# Patient Record
Sex: Male | Born: 1935 | Hispanic: No | Marital: Married
Health system: Southern US, Community
[De-identification: ages and names within clinical notes are randomized; demographics above are authoritative.]

---

## 2004-12-10 ENCOUNTER — Ambulatory Visit: Payer: Self-pay | Admitting: Radiation Oncology

## 2004-12-24 ENCOUNTER — Ambulatory Visit: Payer: Self-pay | Admitting: Radiation Oncology

## 2005-01-24 ENCOUNTER — Ambulatory Visit: Payer: Self-pay | Admitting: Radiation Oncology

## 2005-04-24 ENCOUNTER — Emergency Department: Payer: Self-pay | Admitting: Emergency Medicine

## 2005-04-26 ENCOUNTER — Emergency Department: Payer: Self-pay | Admitting: Emergency Medicine

## 2005-06-27 ENCOUNTER — Ambulatory Visit: Payer: Self-pay | Admitting: Radiation Oncology

## 2005-07-26 ENCOUNTER — Ambulatory Visit: Payer: Self-pay | Admitting: Radiation Oncology

## 2005-11-06 ENCOUNTER — Ambulatory Visit: Payer: Self-pay | Admitting: Gastroenterology

## 2006-01-21 ENCOUNTER — Ambulatory Visit: Payer: Self-pay

## 2006-10-02 ENCOUNTER — Ambulatory Visit: Payer: Self-pay | Admitting: Radiation Oncology

## 2006-10-25 ENCOUNTER — Ambulatory Visit: Payer: Self-pay | Admitting: Radiation Oncology

## 2007-03-27 ENCOUNTER — Ambulatory Visit: Payer: Self-pay | Admitting: Radiation Oncology

## 2007-04-20 ENCOUNTER — Ambulatory Visit: Payer: Self-pay | Admitting: Radiation Oncology

## 2007-04-27 ENCOUNTER — Ambulatory Visit: Payer: Self-pay | Admitting: Radiation Oncology

## 2007-11-09 ENCOUNTER — Ambulatory Visit: Payer: Self-pay | Admitting: Urology

## 2007-12-11 ENCOUNTER — Ambulatory Visit: Payer: Self-pay | Admitting: Urology

## 2008-02-24 ENCOUNTER — Other Ambulatory Visit: Payer: Self-pay

## 2008-02-24 ENCOUNTER — Inpatient Hospital Stay: Payer: Self-pay | Admitting: Internal Medicine

## 2008-03-03 ENCOUNTER — Other Ambulatory Visit: Payer: Self-pay

## 2008-03-03 ENCOUNTER — Emergency Department: Payer: Self-pay | Admitting: Emergency Medicine

## 2008-03-24 ENCOUNTER — Emergency Department: Payer: Self-pay | Admitting: Emergency Medicine

## 2008-03-24 ENCOUNTER — Other Ambulatory Visit: Payer: Self-pay

## 2008-04-28 ENCOUNTER — Ambulatory Visit: Payer: Self-pay | Admitting: General Surgery

## 2009-03-10 ENCOUNTER — Observation Stay: Payer: Self-pay | Admitting: Internal Medicine

## 2009-03-17 ENCOUNTER — Ambulatory Visit: Payer: Self-pay | Admitting: Urology

## 2009-06-29 ENCOUNTER — Emergency Department: Payer: Self-pay | Admitting: Emergency Medicine

## 2009-07-28 ENCOUNTER — Ambulatory Visit: Payer: Self-pay | Admitting: Specialist

## 2009-10-20 ENCOUNTER — Emergency Department: Payer: Self-pay | Admitting: Emergency Medicine

## 2009-10-26 ENCOUNTER — Emergency Department: Payer: Self-pay | Admitting: Internal Medicine

## 2009-12-06 ENCOUNTER — Ambulatory Visit: Payer: Self-pay | Admitting: Ophthalmology

## 2009-12-12 ENCOUNTER — Ambulatory Visit: Payer: Self-pay | Admitting: Cardiology

## 2009-12-20 ENCOUNTER — Ambulatory Visit: Payer: Self-pay | Admitting: Ophthalmology

## 2010-02-16 ENCOUNTER — Ambulatory Visit: Payer: Self-pay | Admitting: Specialist

## 2010-06-13 ENCOUNTER — Emergency Department: Payer: Self-pay | Admitting: Unknown Physician Specialty

## 2010-06-28 ENCOUNTER — Ambulatory Visit: Payer: Self-pay | Admitting: Urology

## 2010-07-17 ENCOUNTER — Ambulatory Visit: Payer: Self-pay | Admitting: Urology

## 2010-08-01 ENCOUNTER — Ambulatory Visit: Payer: Self-pay | Admitting: Urology

## 2010-10-11 ENCOUNTER — Emergency Department: Payer: Self-pay | Admitting: Emergency Medicine

## 2011-03-05 ENCOUNTER — Ambulatory Visit: Payer: Self-pay | Admitting: Urology

## 2011-03-06 ENCOUNTER — Ambulatory Visit: Payer: Self-pay | Admitting: Urology

## 2011-03-08 LAB — PATHOLOGY REPORT

## 2011-03-17 ENCOUNTER — Ambulatory Visit: Payer: Self-pay | Admitting: Internal Medicine

## 2011-04-15 ENCOUNTER — Ambulatory Visit: Payer: Self-pay | Admitting: Oncology

## 2011-04-17 ENCOUNTER — Ambulatory Visit: Payer: Self-pay | Admitting: Oncology

## 2011-04-27 ENCOUNTER — Ambulatory Visit: Payer: Self-pay | Admitting: Oncology

## 2011-05-27 ENCOUNTER — Ambulatory Visit: Payer: Self-pay | Admitting: Oncology

## 2011-05-30 ENCOUNTER — Ambulatory Visit: Payer: Self-pay | Admitting: Urology

## 2011-06-05 ENCOUNTER — Ambulatory Visit: Payer: Self-pay | Admitting: Urology

## 2011-07-13 ENCOUNTER — Emergency Department: Payer: Self-pay | Admitting: Emergency Medicine

## 2011-08-14 ENCOUNTER — Ambulatory Visit: Payer: Self-pay | Admitting: Internal Medicine

## 2011-09-09 ENCOUNTER — Ambulatory Visit: Payer: Self-pay

## 2011-10-09 ENCOUNTER — Ambulatory Visit: Payer: Self-pay | Admitting: Oncology

## 2011-10-09 LAB — CBC CANCER CENTER
Basophil #: 0 x10 3/mm (ref 0.0–0.1)
Eosinophil #: 0.4 x10 3/mm (ref 0.0–0.7)
Lymphocyte #: 1.2 x10 3/mm (ref 1.0–3.6)
Lymphocyte %: 17.5 %
MCH: 32.3 pg (ref 26.0–34.0)
Monocyte #: 0.5 x10 3/mm (ref 0.0–0.7)
Monocyte %: 7.4 %
Neutrophil #: 4.9 x10 3/mm (ref 1.4–6.5)
Neutrophil %: 69 %
Platelet: 280 x10 3/mm (ref 150–440)
RDW: 14.3 % (ref 11.5–14.5)
WBC: 7.1 x10 3/mm (ref 3.8–10.6)

## 2011-10-09 LAB — BASIC METABOLIC PANEL
Anion Gap: 9 (ref 7–16)
Calcium, Total: 8.3 mg/dL — ABNORMAL LOW (ref 8.5–10.1)
Chloride: 100 mmol/L (ref 98–107)
Co2: 29 mmol/L (ref 21–32)
EGFR (African American): >60
Osmolality: 283 (ref 275–301)

## 2011-10-10 LAB — KAPPA/LAMBDA FREE LIGHT CHAINS (ARMC)

## 2011-10-10 LAB — PROT IMMUNOELECTROPHORES(ARMC)

## 2011-10-25 ENCOUNTER — Ambulatory Visit: Payer: Self-pay | Admitting: Oncology

## 2011-11-25 ENCOUNTER — Ambulatory Visit: Payer: Self-pay | Admitting: Oncology

## 2012-01-20 ENCOUNTER — Ambulatory Visit: Payer: Self-pay | Admitting: Urology

## 2012-01-20 LAB — CBC WITH DIFFERENTIAL/PLATELET
Basophil %: 1.3 %
Eosinophil #: 0.5 10*3/uL (ref 0.0–0.7)
HGB: 13.8 g/dL (ref 13.0–18.0)
Lymphocyte #: 1.4 10*3/uL (ref 1.0–3.6)
Lymphocyte %: 18.6 %
MCH: 31.3 pg (ref 26.0–34.0)
MCHC: 33 g/dL (ref 32.0–36.0)
MCV: 95 fL (ref 80–100)
Monocyte #: 0.6 x10 3/mm (ref 0.2–1.0)
Monocyte %: 7.9 %
Neutrophil %: 65 %
Platelet: 266 10*3/uL (ref 150–440)
RBC: 4.41 10*6/uL (ref 4.40–5.90)
WBC: 7.6 10*3/uL (ref 3.8–10.6)

## 2012-01-20 LAB — BASIC METABOLIC PANEL
Anion Gap: 9 (ref 7–16)
Calcium, Total: 8.6 mg/dL (ref 8.5–10.1)
Chloride: 103 mmol/L (ref 98–107)
Co2: 28 mmol/L (ref 21–32)
Creatinine: 1.07 mg/dL (ref 0.60–1.30)
Potassium: 3.9 mmol/L (ref 3.5–5.1)

## 2012-01-21 LAB — URINE CULTURE

## 2012-01-29 ENCOUNTER — Ambulatory Visit: Payer: Self-pay | Admitting: Urology

## 2012-01-31 LAB — PATHOLOGY REPORT

## 2012-02-05 ENCOUNTER — Emergency Department: Payer: Self-pay | Admitting: Emergency Medicine

## 2012-02-05 LAB — COMPREHENSIVE METABOLIC PANEL
Alkaline Phosphatase: 99 U/L (ref 50–136)
Anion Gap: 8 (ref 7–16)
Calcium, Total: 8.3 mg/dL — ABNORMAL LOW (ref 8.5–10.1)
Chloride: 103 mmol/L (ref 98–107)
Creatinine: 0.89 mg/dL (ref 0.60–1.30)
EGFR (African American): >60
Glucose: 154 mg/dL — ABNORMAL HIGH (ref 65–99)
Osmolality: 277 (ref 275–301)
Potassium: 3.9 mmol/L (ref 3.5–5.1)
SGPT (ALT): 25 U/L
Sodium: 136 mmol/L (ref 136–145)
Total Protein: 7.8 g/dL (ref 6.4–8.2)

## 2012-02-05 LAB — CBC
HGB: 13.7 g/dL (ref 13.0–18.0)
MCH: 32.2 pg (ref 26.0–34.0)
MCHC: 34.6 g/dL (ref 32.0–36.0)
MCV: 93 fL (ref 80–100)
RBC: 4.25 10*6/uL — ABNORMAL LOW (ref 4.40–5.90)
WBC: 8.3 10*3/uL (ref 3.8–10.6)

## 2012-02-05 LAB — URINALYSIS, COMPLETE
Nitrite: NEGATIVE
Ph: 6 (ref 4.5–8.0)
Protein: 100
RBC,UR: 19 /HPF (ref 0–5)
Squamous Epithelial: NONE SEEN
WBC UR: 30 /HPF (ref 0–5)

## 2012-02-05 LAB — TROPONIN I: Troponin-I: <0.02 ng/mL

## 2012-02-05 LAB — PROTIME-INR: Prothrombin Time: 13.5 secs (ref 11.5–14.7)

## 2012-02-16 ENCOUNTER — Ambulatory Visit: Payer: Self-pay | Admitting: Internal Medicine

## 2012-08-31 ENCOUNTER — Ambulatory Visit: Payer: Self-pay | Admitting: Physician Assistant

## 2012-10-15 LAB — URINALYSIS, COMPLETE
Ketone: NEGATIVE
WBC UR: 28 /HPF (ref 0–5)

## 2012-10-16 ENCOUNTER — Inpatient Hospital Stay: Payer: Self-pay | Admitting: Internal Medicine

## 2012-10-16 LAB — COMPREHENSIVE METABOLIC PANEL
Albumin: 3.4 g/dL (ref 3.4–5.0)
Alkaline Phosphatase: 84 U/L (ref 50–136)
BUN: 33 mg/dL — ABNORMAL HIGH (ref 7–18)
Bilirubin,Total: 0.4 mg/dL (ref 0.2–1.0)
Calcium, Total: 8.4 mg/dL — ABNORMAL LOW (ref 8.5–10.1)
Chloride: 102 mmol/L (ref 98–107)
Co2: 26 mmol/L (ref 21–32)
Creatinine: 1.55 mg/dL — ABNORMAL HIGH (ref 0.60–1.30)
EGFR (African American): 44 — ABNORMAL LOW
EGFR (Non-African Amer.): 38 — ABNORMAL LOW
Osmolality: 283 (ref 275–301)
Potassium: 3.9 mmol/L (ref 3.5–5.1)
SGOT(AST): 34 U/L (ref 15–37)
Sodium: 137 mmol/L (ref 136–145)
Total Protein: 7.4 g/dL (ref 6.4–8.2)

## 2012-10-16 LAB — CBC
HCT: 38.5 % — ABNORMAL LOW (ref 40.0–52.0)
HGB: 13 g/dL (ref 13.0–18.0)
MCH: 30.8 pg (ref 26.0–34.0)
MCHC: 33.9 g/dL (ref 32.0–36.0)
MCV: 91 fL (ref 80–100)
RDW: 14.7 % — ABNORMAL HIGH (ref 11.5–14.5)

## 2012-10-16 LAB — TSH: Thyroid Stimulating Horm: 6.74 u[IU]/mL — ABNORMAL HIGH

## 2012-10-16 LAB — MAGNESIUM: Magnesium: 2.1 mg/dL

## 2012-10-17 LAB — BASIC METABOLIC PANEL
Anion Gap: 9 (ref 7–16)
BUN: 14 mg/dL (ref 7–18)
Calcium, Total: 8.7 mg/dL (ref 8.5–10.1)
Chloride: 103 mmol/L (ref 98–107)
Co2: 25 mmol/L (ref 21–32)
Creatinine: 1.11 mg/dL (ref 0.60–1.30)
EGFR (African American): >60
Glucose: 139 mg/dL — ABNORMAL HIGH (ref 65–99)
Osmolality: 277 (ref 275–301)
Potassium: 3.6 mmol/L (ref 3.5–5.1)
Sodium: 137 mmol/L (ref 136–145)

## 2012-10-18 LAB — CBC WITH DIFFERENTIAL/PLATELET
Basophil #: 0.1 10*3/uL (ref 0.0–0.1)
Basophil %: 1.3 %
Eosinophil #: 0.4 10*3/uL (ref 0.0–0.7)
Eosinophil %: 5.1 %
HCT: 39.1 % — ABNORMAL LOW (ref 40.0–52.0)
HGB: 13.1 g/dL (ref 13.0–18.0)
Lymphocyte #: 1.7 10*3/uL (ref 1.0–3.6)
MCH: 30.6 pg (ref 26.0–34.0)
MCHC: 33.6 g/dL (ref 32.0–36.0)
Monocyte #: 0.6 x10 3/mm (ref 0.2–1.0)
Neutrophil #: 5.5 10*3/uL (ref 1.4–6.5)
Neutrophil %: 66.4 %
Platelet: 263 10*3/uL (ref 150–440)
RBC: 4.3 10*6/uL — ABNORMAL LOW (ref 4.40–5.90)
RDW: 14.8 % — ABNORMAL HIGH (ref 11.5–14.5)
WBC: 8.3 10*3/uL (ref 3.8–10.6)

## 2012-10-18 LAB — BASIC METABOLIC PANEL
Calcium, Total: 8.2 mg/dL — ABNORMAL LOW (ref 8.5–10.1)
Co2: 27 mmol/L (ref 21–32)
Creatinine: 1.32 mg/dL — ABNORMAL HIGH (ref 0.60–1.30)
EGFR (African American): 53 — ABNORMAL LOW
EGFR (Non-African Amer.): 46 — ABNORMAL LOW
Osmolality: 281 (ref 275–301)
Sodium: 139 mmol/L (ref 136–145)

## 2012-10-19 LAB — BASIC METABOLIC PANEL
Anion Gap: 7 (ref 7–16)
BUN: 18 mg/dL (ref 7–18)
Co2: 26 mmol/L (ref 21–32)
Creatinine: 1.14 mg/dL (ref 0.60–1.30)
EGFR (Non-African Amer.): 55 — ABNORMAL LOW
Glucose: 105 mg/dL — ABNORMAL HIGH (ref 65–99)
Sodium: 140 mmol/L (ref 136–145)

## 2012-10-20 LAB — BASIC METABOLIC PANEL
Anion Gap: 10 (ref 7–16)
BUN: 16 mg/dL (ref 7–18)
Calcium, Total: 8.4 mg/dL — ABNORMAL LOW (ref 8.5–10.1)
Chloride: 106 mmol/L (ref 98–107)
Co2: 23 mmol/L (ref 21–32)
Creatinine: 0.97 mg/dL (ref 0.60–1.30)
EGFR (African American): >60
EGFR (Non-African Amer.): >60
Glucose: 90 mg/dL (ref 65–99)
Osmolality: 278 (ref 275–301)
Sodium: 139 mmol/L (ref 136–145)

## 2012-10-20 LAB — HEMOGLOBIN A1C: Hemoglobin A1C: 6.6 % — ABNORMAL HIGH (ref 4.2–6.3)

## 2012-10-21 LAB — CULTURE, BLOOD (SINGLE)

## 2012-11-06 ENCOUNTER — Ambulatory Visit: Payer: Self-pay | Admitting: Physician Assistant

## 2012-11-13 ENCOUNTER — Emergency Department: Payer: Self-pay | Admitting: Emergency Medicine

## 2012-11-13 LAB — CBC WITH DIFFERENTIAL/PLATELET
Basophil #: 0.1 10*3/uL (ref 0.0–0.1)
Basophil %: 0.7 %
Eosinophil #: 0.4 10*3/uL (ref 0.0–0.7)
HCT: 44.9 % (ref 40.0–52.0)
HGB: 15.4 g/dL (ref 13.0–18.0)
Monocyte %: 5.2 %
Neutrophil %: 85.8 %
Platelet: 315 10*3/uL (ref 150–440)
RBC: 4.92 10*6/uL (ref 4.40–5.90)
WBC: 18.9 10*3/uL — ABNORMAL HIGH (ref 3.8–10.6)

## 2012-11-13 LAB — URINALYSIS, COMPLETE
Bilirubin,UR: NEGATIVE
Glucose,UR: NEGATIVE mg/dL (ref 0–75)
Ph: 5 (ref 4.5–8.0)
Protein: 30
RBC,UR: 422 /HPF (ref 0–5)
WBC UR: 24 /HPF (ref 0–5)

## 2012-11-13 LAB — BASIC METABOLIC PANEL
Anion Gap: 7 (ref 7–16)
BUN: 29 mg/dL — ABNORMAL HIGH (ref 7–18)
Calcium, Total: 9.2 mg/dL (ref 8.5–10.1)
EGFR (African American): 42 — ABNORMAL LOW
Osmolality: 278 (ref 275–301)
Potassium: 4.2 mmol/L (ref 3.5–5.1)
Sodium: 135 mmol/L — ABNORMAL LOW (ref 136–145)

## 2012-11-14 LAB — URINE CULTURE

## 2012-11-24 ENCOUNTER — Ambulatory Visit: Payer: Self-pay | Admitting: Internal Medicine

## 2012-11-25 ENCOUNTER — Inpatient Hospital Stay: Payer: Self-pay | Admitting: Internal Medicine

## 2012-11-25 LAB — URINALYSIS, COMPLETE
Bilirubin,UR: NEGATIVE
Glucose,UR: NEGATIVE mg/dL (ref 0–75)
Hyaline Cast: 3
Ketone: NEGATIVE
Nitrite: NEGATIVE
Protein: NEGATIVE
RBC,UR: 17 /HPF (ref 0–5)
Specific Gravity: 1.015 (ref 1.003–1.030)

## 2012-11-25 LAB — CBC
HCT: 38.5 % — ABNORMAL LOW (ref 40.0–52.0)
HGB: 13 g/dL (ref 13.0–18.0)
MCH: 30.8 pg (ref 26.0–34.0)
MCHC: 33.7 g/dL (ref 32.0–36.0)
MCV: 92 fL (ref 80–100)
Platelet: 274 10*3/uL (ref 150–440)

## 2012-11-25 LAB — COMPREHENSIVE METABOLIC PANEL
Albumin: 3.2 g/dL — ABNORMAL LOW (ref 3.4–5.0)
Anion Gap: 10 (ref 7–16)
Bilirubin,Total: 0.5 mg/dL (ref 0.2–1.0)
Calcium, Total: 8.3 mg/dL — ABNORMAL LOW (ref 8.5–10.1)
Co2: 26 mmol/L (ref 21–32)
EGFR (African American): 43 — ABNORMAL LOW
EGFR (Non-African Amer.): 37 — ABNORMAL LOW
Glucose: 108 mg/dL — ABNORMAL HIGH (ref 65–99)
SGOT(AST): 29 U/L (ref 15–37)
SGPT (ALT): 26 U/L (ref 12–78)
Total Protein: 7.2 g/dL (ref 6.4–8.2)

## 2012-11-25 LAB — TROPONIN I: Troponin-I: <0.02 ng/mL

## 2012-11-26 LAB — BASIC METABOLIC PANEL
BUN: 28 mg/dL — ABNORMAL HIGH (ref 7–18)
Calcium, Total: 8.1 mg/dL — ABNORMAL LOW (ref 8.5–10.1)
Chloride: 104 mmol/L (ref 98–107)
Co2: 27 mmol/L (ref 21–32)
Creatinine: 1.25 mg/dL (ref 0.60–1.30)
Osmolality: 286 (ref 275–301)
Potassium: 3.2 mmol/L — ABNORMAL LOW (ref 3.5–5.1)
Sodium: 140 mmol/L (ref 136–145)

## 2012-11-26 LAB — CBC WITH DIFFERENTIAL/PLATELET
Basophil #: 0.1 10*3/uL (ref 0.0–0.1)
Eosinophil #: 0.7 10*3/uL (ref 0.0–0.7)
HCT: 38.3 % — ABNORMAL LOW (ref 40.0–52.0)
Lymphocyte #: 1.6 10*3/uL (ref 1.0–3.6)
Lymphocyte %: 18.7 %
MCH: 30.5 pg (ref 26.0–34.0)
MCHC: 33.6 g/dL (ref 32.0–36.0)
MCV: 91 fL (ref 80–100)
Monocyte %: 5.5 %
Neutrophil %: 66.9 %
RDW: 14.6 % — ABNORMAL HIGH (ref 11.5–14.5)

## 2012-11-27 LAB — BASIC METABOLIC PANEL
Anion Gap: 7 (ref 7–16)
BUN: 16 mg/dL (ref 7–18)
Calcium, Total: 8 mg/dL — ABNORMAL LOW (ref 8.5–10.1)
EGFR (Non-African Amer.): >60
Glucose: 117 mg/dL — ABNORMAL HIGH (ref 65–99)
Potassium: 3.1 mmol/L — ABNORMAL LOW (ref 3.5–5.1)

## 2012-11-27 LAB — URINE CULTURE

## 2012-11-28 LAB — BASIC METABOLIC PANEL
BUN: 12 mg/dL (ref 7–18)
Calcium, Total: 8.1 mg/dL — ABNORMAL LOW (ref 8.5–10.1)
Chloride: 108 mmol/L — ABNORMAL HIGH (ref 98–107)
Co2: 26 mmol/L (ref 21–32)
Creatinine: 0.8 mg/dL (ref 0.60–1.30)
EGFR (African American): >60
EGFR (Non-African Amer.): >60
Glucose: 110 mg/dL — ABNORMAL HIGH (ref 65–99)
Osmolality: 282 (ref 275–301)
Sodium: 141 mmol/L (ref 136–145)

## 2012-12-01 LAB — CULTURE, BLOOD (SINGLE)

## 2012-12-10 ENCOUNTER — Observation Stay: Payer: Self-pay | Admitting: Internal Medicine

## 2012-12-10 LAB — CBC
HCT: 38.5 % — ABNORMAL LOW (ref 40.0–52.0)
MCH: 31.1 pg (ref 26.0–34.0)
MCHC: 33.9 g/dL (ref 32.0–36.0)
RBC: 4.19 10*6/uL — ABNORMAL LOW (ref 4.40–5.90)
RDW: 14.3 % (ref 11.5–14.5)
WBC: 10 10*3/uL (ref 3.8–10.6)

## 2012-12-10 LAB — COMPREHENSIVE METABOLIC PANEL
Albumin: 3.3 g/dL — ABNORMAL LOW (ref 3.4–5.0)
Anion Gap: 6 — ABNORMAL LOW (ref 7–16)
Bilirubin,Total: 0.8 mg/dL (ref 0.2–1.0)
Calcium, Total: 8.9 mg/dL (ref 8.5–10.1)
Co2: 32 mmol/L (ref 21–32)
Creatinine: 1.37 mg/dL — ABNORMAL HIGH (ref 0.60–1.30)
EGFR (Non-African Amer.): 44 — ABNORMAL LOW
Osmolality: 279 (ref 275–301)
Potassium: 3.4 mmol/L — ABNORMAL LOW (ref 3.5–5.1)
SGOT(AST): 51 U/L — ABNORMAL HIGH (ref 15–37)
SGPT (ALT): 26 U/L (ref 12–78)
Sodium: 138 mmol/L (ref 136–145)

## 2012-12-10 LAB — URINALYSIS, COMPLETE
Bilirubin,UR: NEGATIVE
Glucose,UR: NEGATIVE mg/dL (ref 0–75)
Hyaline Cast: 14
RBC,UR: 3 /HPF (ref 0–5)
Specific Gravity: 1.013 (ref 1.003–1.030)
WBC UR: 8 /HPF (ref 0–5)

## 2012-12-10 LAB — CK TOTAL AND CKMB (NOT AT ARMC)
CK, Total: 579 U/L — ABNORMAL HIGH (ref 35–232)
CK-MB: 2.1 ng/mL (ref 0.5–3.6)

## 2012-12-11 LAB — BASIC METABOLIC PANEL
Anion Gap: 5 — ABNORMAL LOW (ref 7–16)
Calcium, Total: 8.4 mg/dL — ABNORMAL LOW (ref 8.5–10.1)
Co2: 31 mmol/L (ref 21–32)
Creatinine: 1.29 mg/dL (ref 0.60–1.30)
EGFR (Non-African Amer.): 47 — ABNORMAL LOW
Sodium: 138 mmol/L (ref 136–145)

## 2012-12-11 LAB — CBC WITH DIFFERENTIAL/PLATELET
Basophil #: 0.1 10*3/uL (ref 0.0–0.1)
Basophil %: 0.8 %
Eosinophil #: 0.5 10*3/uL (ref 0.0–0.7)
HCT: 38.3 % — ABNORMAL LOW (ref 40.0–52.0)
Lymphocyte %: 15.2 %
MCH: 31.1 pg (ref 26.0–34.0)
Monocyte #: 0.5 x10 3/mm (ref 0.2–1.0)
Platelet: 311 10*3/uL (ref 150–440)
RDW: 14.1 % (ref 11.5–14.5)
WBC: 8.2 10*3/uL (ref 3.8–10.6)

## 2012-12-11 LAB — MAGNESIUM: Magnesium: 1.5 mg/dL — ABNORMAL LOW

## 2012-12-14 LAB — URINE CULTURE

## 2012-12-24 ENCOUNTER — Ambulatory Visit: Payer: Self-pay | Admitting: Internal Medicine

## 2013-01-04 ENCOUNTER — Inpatient Hospital Stay: Payer: Self-pay | Admitting: Internal Medicine

## 2013-01-04 LAB — COMPREHENSIVE METABOLIC PANEL
Alkaline Phosphatase: 89 U/L (ref 50–136)
Anion Gap: 7 (ref 7–16)
BUN: 12 mg/dL (ref 7–18)
Bilirubin,Total: 0.9 mg/dL (ref 0.2–1.0)
Calcium, Total: 8.4 mg/dL — ABNORMAL LOW (ref 8.5–10.1)
Co2: 24 mmol/L (ref 21–32)
Osmolality: 276 (ref 275–301)
Potassium: 4.2 mmol/L (ref 3.5–5.1)
SGOT(AST): 34 U/L (ref 15–37)
SGPT (ALT): 22 U/L (ref 12–78)
Sodium: 137 mmol/L (ref 136–145)
Total Protein: 7.1 g/dL (ref 6.4–8.2)

## 2013-01-04 LAB — URINALYSIS, COMPLETE
Bilirubin,UR: NEGATIVE
Glucose,UR: NEGATIVE mg/dL (ref 0–75)
RBC,UR: 979 /HPF (ref 0–5)
Squamous Epithelial: NONE SEEN

## 2013-01-04 LAB — TROPONIN I: Troponin-I: <0.02 ng/mL

## 2013-01-04 LAB — CBC
HCT: 40.8 % (ref 40.0–52.0)
HGB: 14 g/dL (ref 13.0–18.0)
MCHC: 34.2 g/dL (ref 32.0–36.0)
Platelet: 280 10*3/uL (ref 150–440)
RDW: 14.4 % (ref 11.5–14.5)

## 2013-01-04 LAB — MAGNESIUM: Magnesium: 1.3 mg/dL — ABNORMAL LOW

## 2013-01-05 LAB — URINE CULTURE

## 2013-01-10 LAB — CULTURE, BLOOD (SINGLE)

## 2013-01-24 ENCOUNTER — Ambulatory Visit: Payer: Self-pay | Admitting: Internal Medicine

## 2013-01-24 DEATH — deceased

## 2014-07-07 IMAGING — CT CT ABD-PELV W/O CM
1 of 2 series · 15 of 32 positions shown, 19 images · non-contrast
Comparison: None

REASON FOR EXAM: (1) Vomiting; (2) Abdominal Pain
COMMENTS:

PROCEDURE:     CT  - CT ABDOMEN AND PELVIS W[DATE] [DATE]
RESULT:     Indication: Abdominal pain, vomiting
TECHNIQUE: Multiple axial images from the lung bases to the symphysis pubis
were obtained without oral and without intravenous contrast.

[Series 2: 3mm soft tissue · axial · 0.78mm/px · z∈[+334,+776]mm · 15 of 162 slices shown, 19 images]
[im 8/162  soft-tissue]
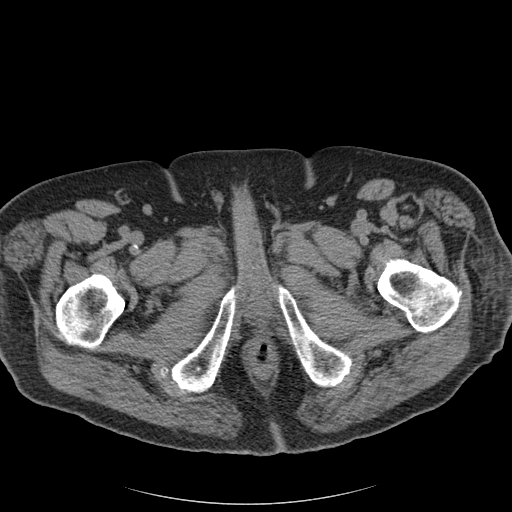
[im 8/162  bone]
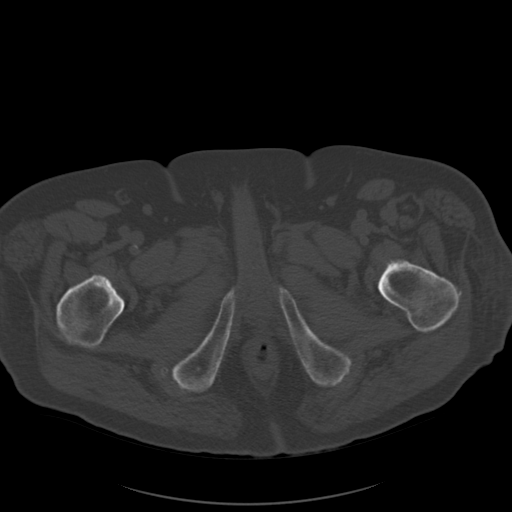
[im 22/162  soft-tissue]
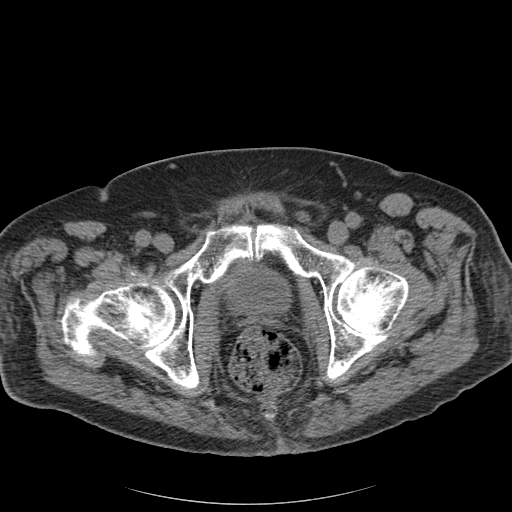
[im 36/162  soft-tissue]
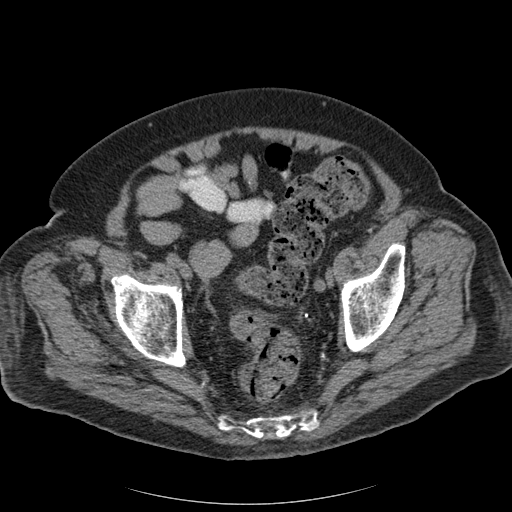
[im 43/162  soft-tissue]
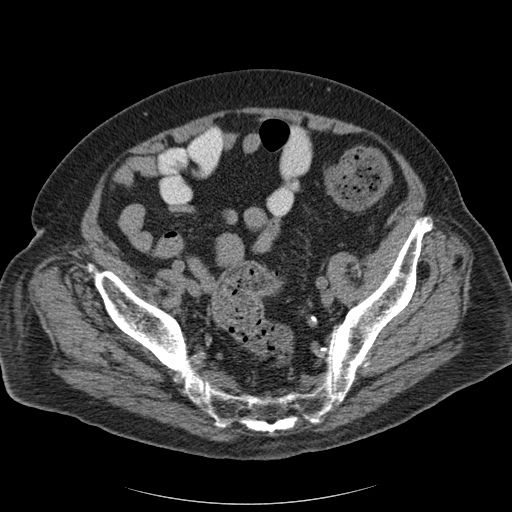
[im 57/162  soft-tissue]
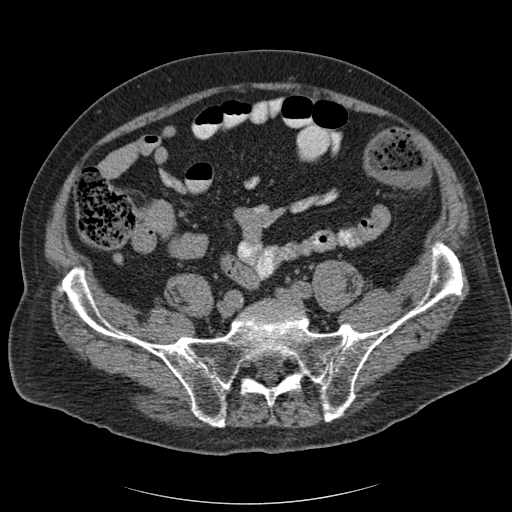
[im 71/162  soft-tissue]
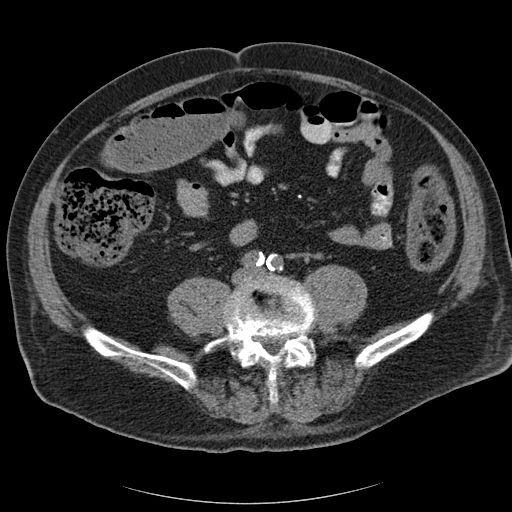
[im 85/162  soft-tissue]
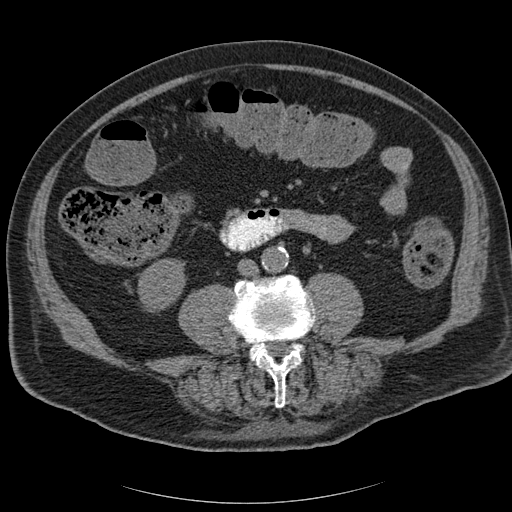
[im 92/162  soft-tissue]
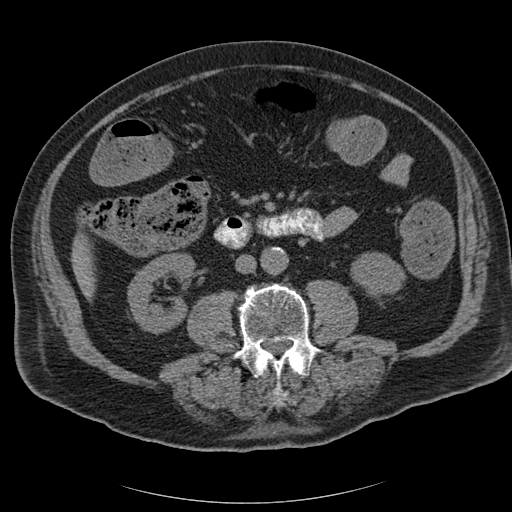
[im 106/162  soft-tissue]
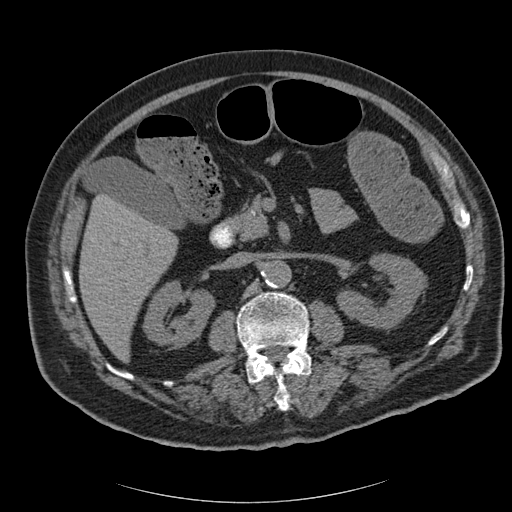
[im 106/162  bone]
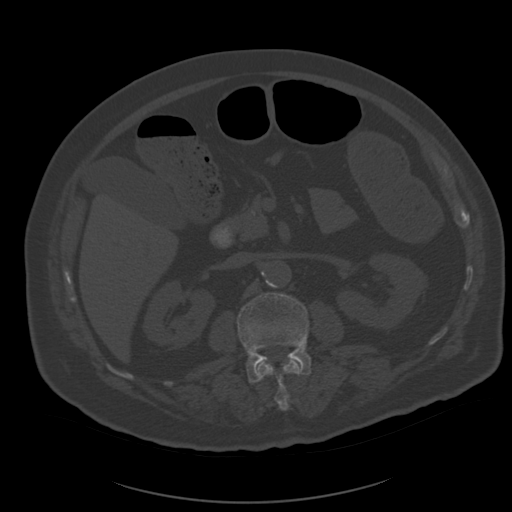
[im 120/162  soft-tissue]
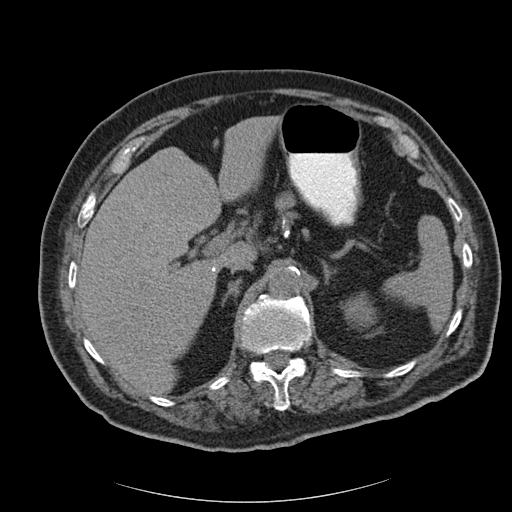
[im 127/162  soft-tissue]
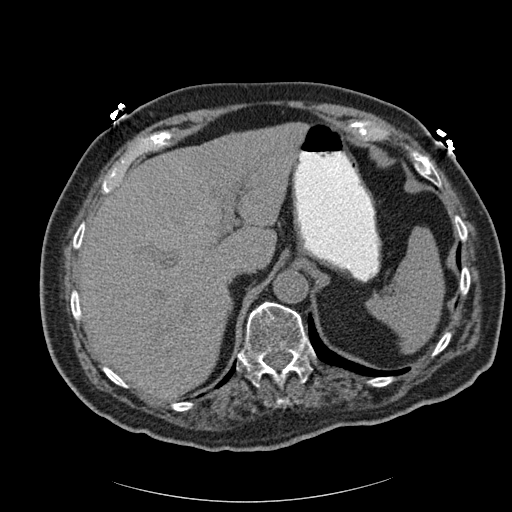
[im 134/162  lung]
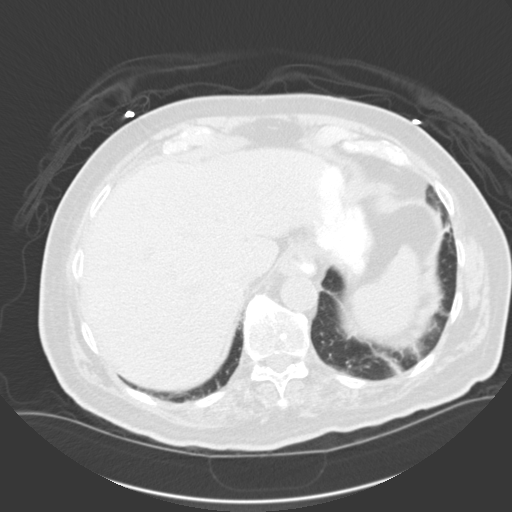
[im 141/162  soft-tissue]
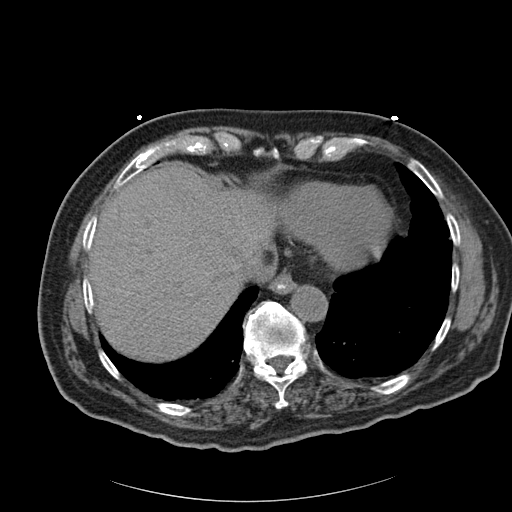
[im 141/162  lung]
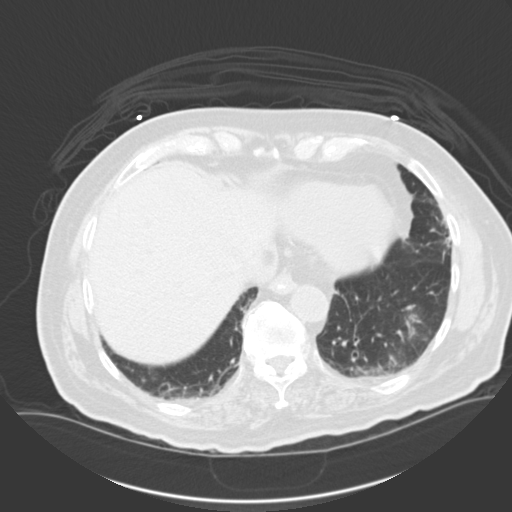
[im 148/162  lung]
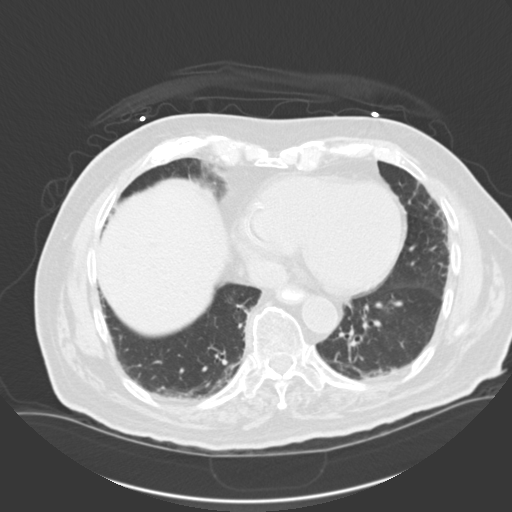
[im 155/162  soft-tissue]
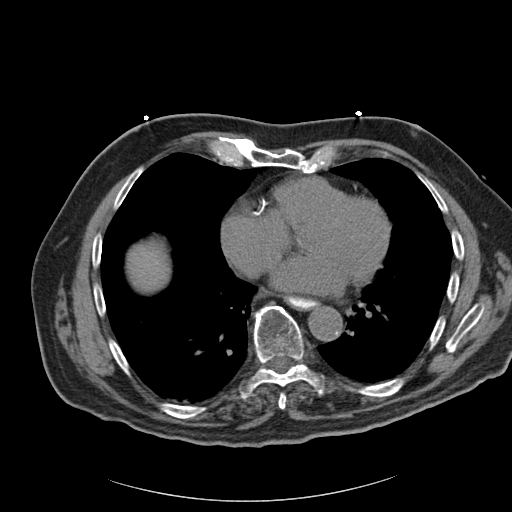
[im 155/162  lung]
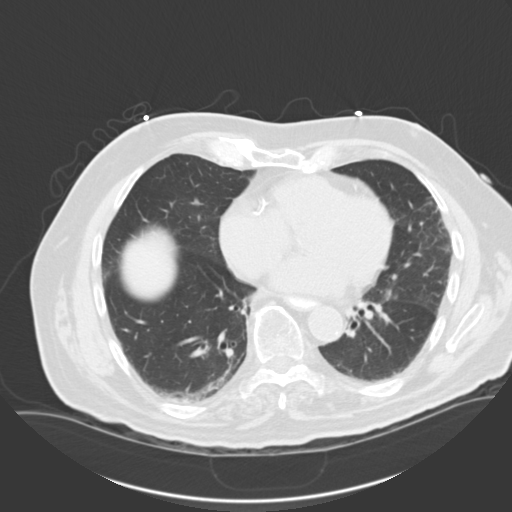

[15 of 32 positions shown; findings below may reference images not displayed]

FINDINGS: The lung bases are clear. There is no pleural or pericardial effusions.

No renal, ureteral, or bladder calculi. No obstructive uropathy. No
perinephric stranding is seen. The kidneys are symmetric in size without
evidence for exophytic mass. The bladder is unremarkable.

The liver demonstrates no focal abnormality. The gallbladder is
unremarkable. The spleen demonstrates no focal abnormality. The adrenal
glands and pancreas are normal.

There is contrast material within the distal esophagus likely reflecting
gastroesophageal reflux. There is a small hiatal hernia. The  stomach,
duodenum, small intestine, and large intestine are unremarkable. There is a
large amount stool throughout the colon. There is a normal caliber appendix
in the right lower quadrant without periappendiceal inflammatory changes.
There is no pneumoperitoneum, pneumatosis, or portal venous gas. There is no
abdominal or pelvic free fluid. There is no lymphadenopathy.

The abdominal aorta is normal in caliber with atherosclerosis.

There is lumbar spine spondylosis.
IMPRESSION: 1. Large amount of stool throughout the colon.
2. Small hiatal hernia. There is contrast material within the distal
esophagus likely reflecting gastroesophageal reflux.
3. Normal appendix.

[REDACTED]

## 2014-07-19 IMAGING — CT CT HEAD WITHOUT CONTRAST
1 series · 16 of 30 positions shown, 20 images · non-contrast
Comparison: none

REASON FOR EXAM: confusion
COMMENTS:

[Series 2: soft tissue · axial · 0.41mm/px · z∈[-273,-133]mm · 16 of 32 slices shown, 20 images]
[im 2/32  brain]
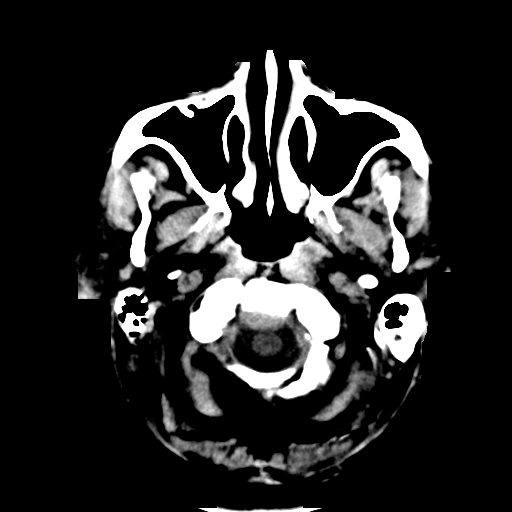
[im 2/32  bone]
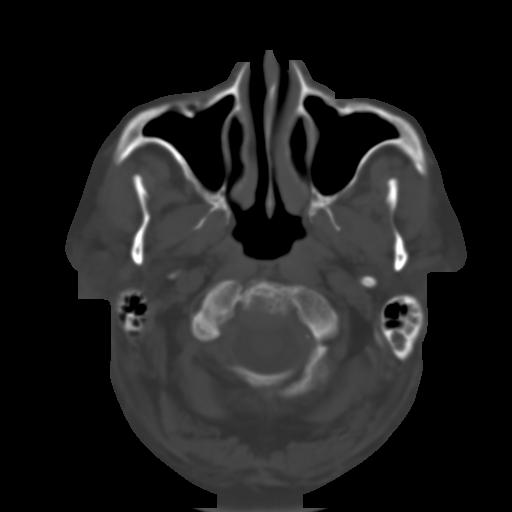
[im 4/32  brain]
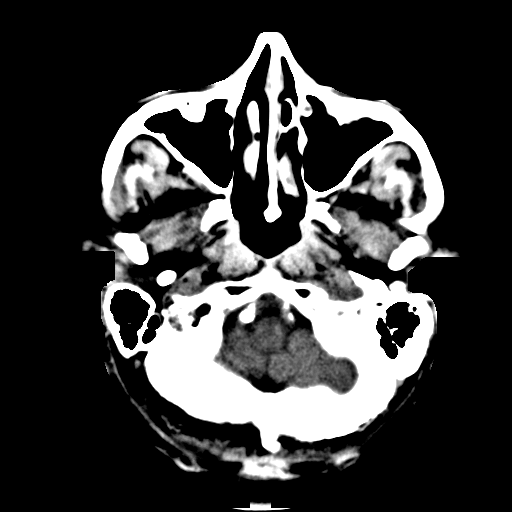
[im 6/32  brain]
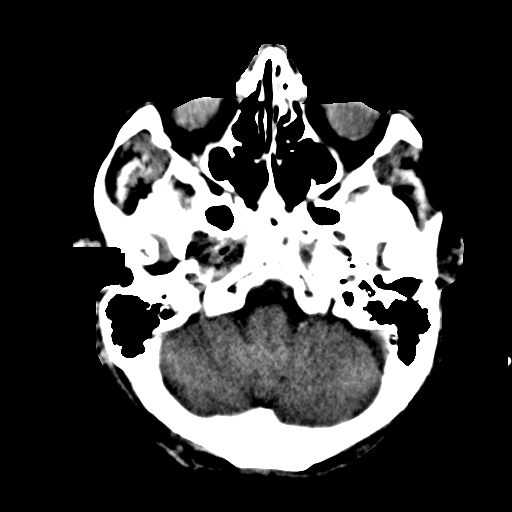
[im 8/32  brain]
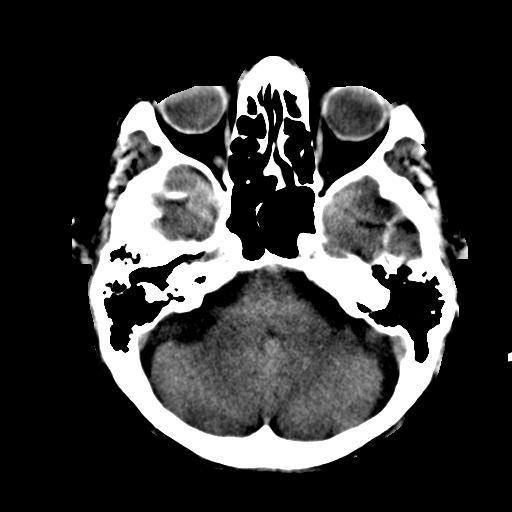
[im 9/32  brain]
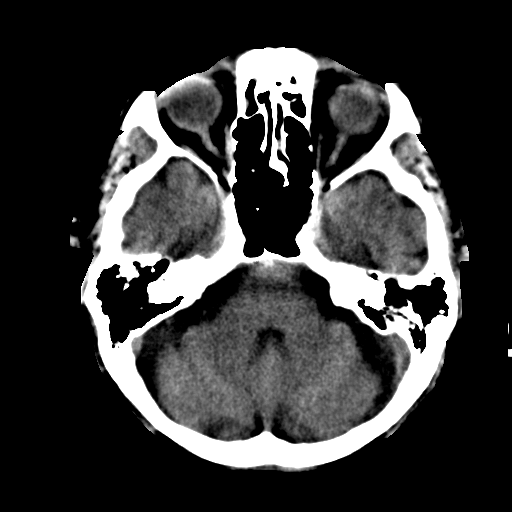
[im 9/32  bone]
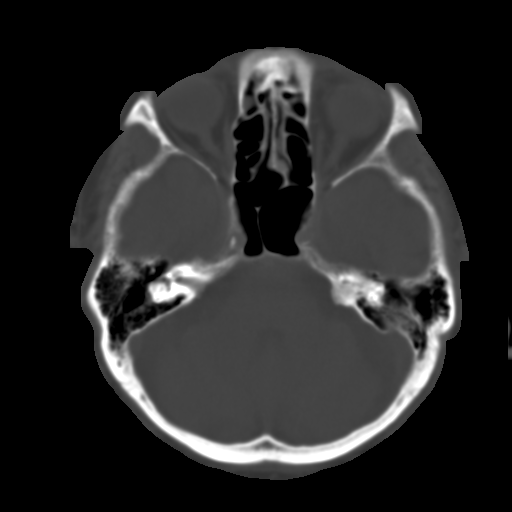
[im 11/32  brain]
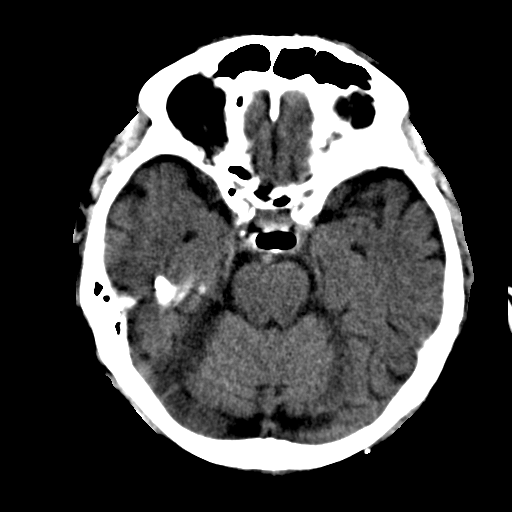
[im 13/32  brain]
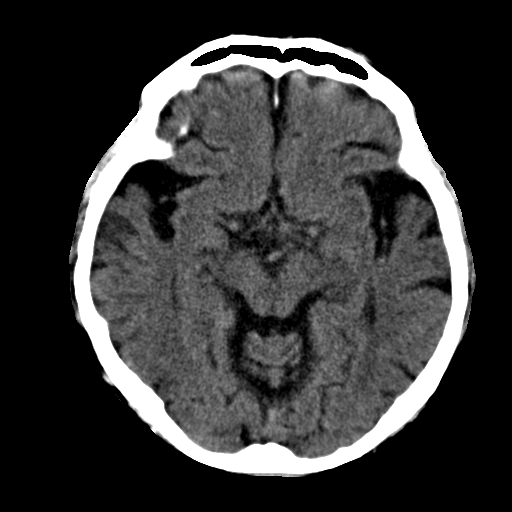
[im 15/32  brain]
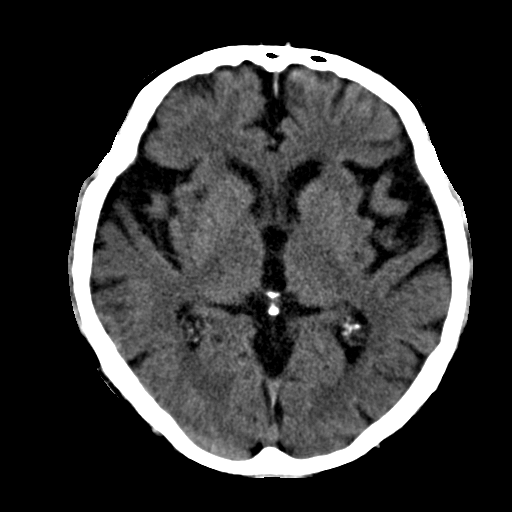
[im 17/32  brain]
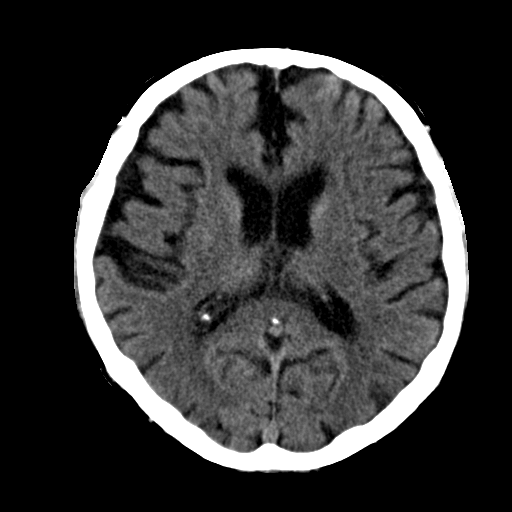
[im 17/32  bone]
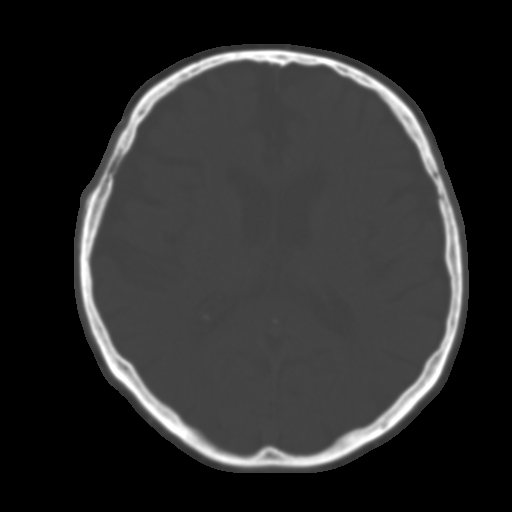
[im 19/32  brain]
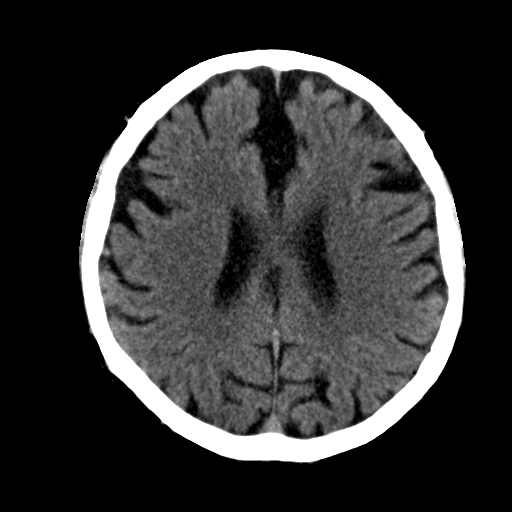
[im 21/32  brain]
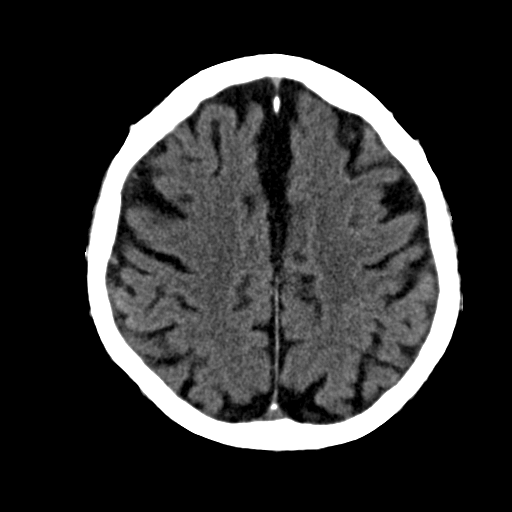
[im 23/32  brain]
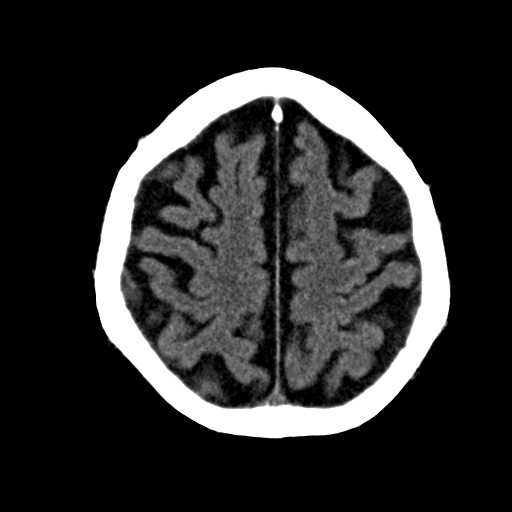
[im 24/32  brain]
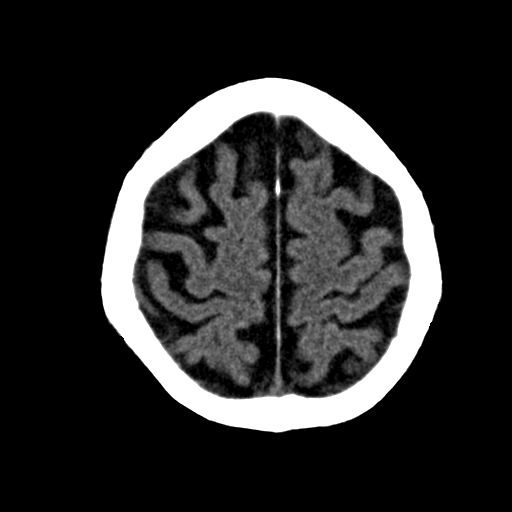
[im 24/32  bone]
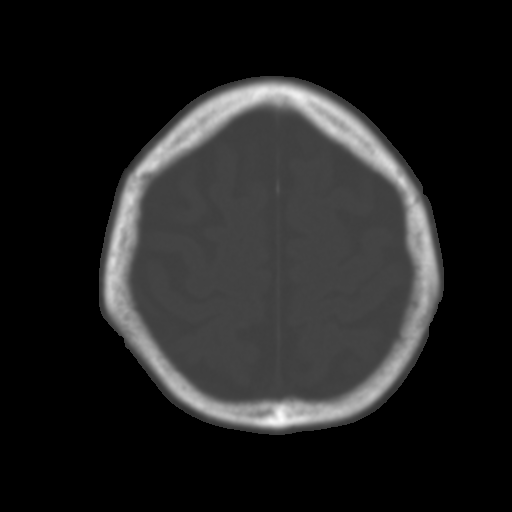
[im 26/32  brain]
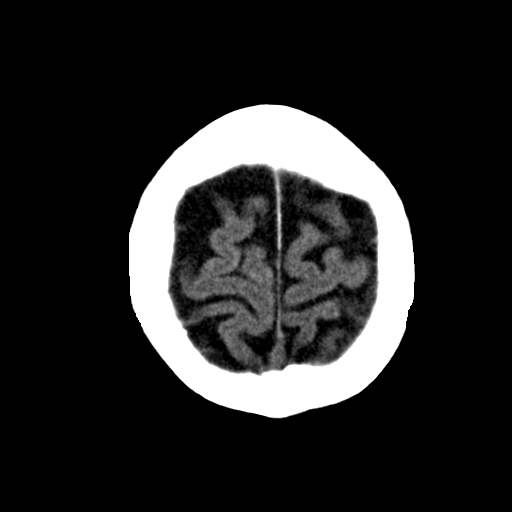
[im 28/32  brain]
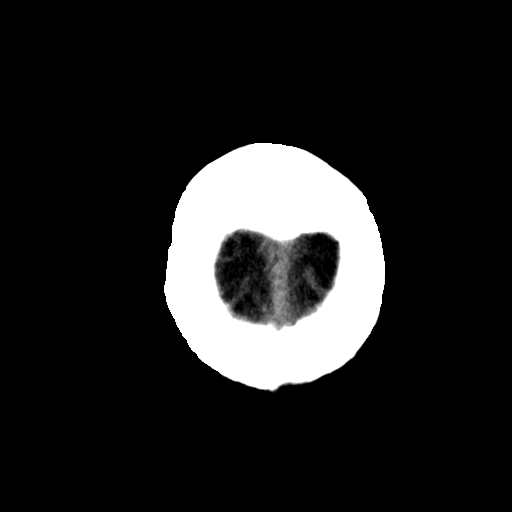
[im 30/32  brain]
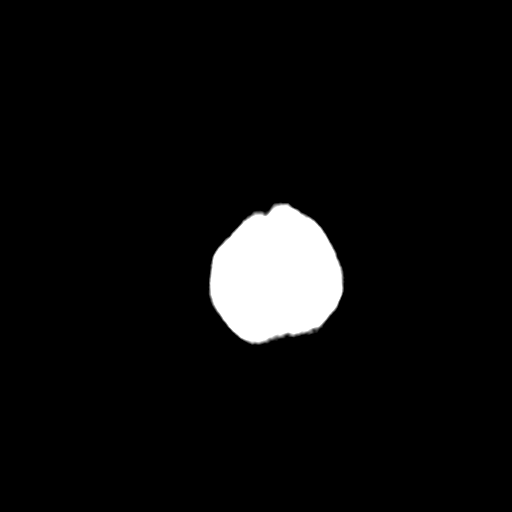

[16 of 30 positions shown; findings below may reference images not displayed]

PROCEDURE:     CT  - CT HEAD WITHOUT CONTRAST  - November 25, 2012  [DATE]

RESULT:     Axial noncontrast CT scanning was performed through the brain
with reconstructions at 5 mm intervals and slice thicknesses. Comparison is
made to the study October 17, 2012.

There is moderate diffuse cerebral and cerebellar atrophy with compensatory
ventriculomegaly. There is no intracranial hemorrhage nor intracranial mass
effect. There is no evidence of an evolving ischemic event. At bone window
settings there is a small amount of mucoperiosteal thickening within the
ethmoid sinus cells. There is or no air fluid levels. There is no evidence
of an acute skull fracture.
IMPRESSION: 1. There is no evidence of an acute ischemic or hemorrhagic infarction.
2. There are stable atrophic changes are present.
3. There is no intracranial mass effect nor hydrocephalus.
4. There is mild mucoperiosteal thickening involving the ethmoid sinuses.

[REDACTED]

## 2014-12-16 NOTE — Discharge Summary (Signed)
PATIENT NAME:  Noah [name], Giovan A MR#:  [id number] DATE OF BIRTH:  09/13/1915  DATE OF ADMISSION:  12/10/2012 DATE OF DISCHARGE:  12/11/2012  DISCHARGE DIAGNOSES:   1.  Altered mental status with metabolic encephalopathy secondary to dehydration.  2.  Acute renal failure, resolved.  3.  History of fall while being transported here to the ER.   OTHER DIAGNOSES:  Includes:  1.  Recurrent bladder cancer.  2.  Recurrent prostate cancer.  3.  Dementia.  4.  Hypertension.  5.  Gastroesophageal reflux disease.  6.  Hypothyroidism.  7.  Paroxysmal atrial fibrillation.  8.  History of diastolic heart failure.  9.  Chronic obstructive pulmonary disease.   CONSULTATIONS:  Palliative care consult with Dr. Harvie JuniorPhifer.   DISCHARGE MEDICATIONS:  Requip 2 mg by mouth 3 times daily, Phenergan 25 mg by mouth 3 times daily, levothyroxine 112 mcg by mouth daily, Amaryl 1 mg by mouth daily, amiodarone 200 mg by mouth daily.   MEDICATIONS TO BE STOPPED:  Including HCTZ, cephalexin, Lasix and Benazepril because he is just recovering from renal failure and dehydration.   LABORATORY DATA:  Troponin less than 0.02.  CT head was done on admission, showed chronic involutional changes without any acute changes.  CT of the cervical spine showed multilevel degenerative disk disease.  Ankle x-ray on the right, no fractures.  Electrolytes on admission:  Sodium 138, potassium 3.4, chloride is 100, bicarbonate 22, BUN 22, creatinine 1.3, serum glucose 92.  The patient's WBC was normal.  Urine cultures, no growth.  The patient's UA was clear.  Chest x-ray showed no acute abnormality.  Kidney function this morning, BUN 22, creatinine 1.29.   HOSPITAL COURSE:   1.  This is a 81 year old male with history of prostate cancer, bladder cancer, status post radiation seeding, prostatectomy, recurrent UTIs, dementia, hypertension, diabetes, GERD, RLS, hypothyroidism, history of diastolic heart failure, COPD, admitted on April 17th because  of altered mental status and dehydration.  The patient was here previously from April 3rd to April 5th because of altered mental status, UTI, admitted for dehydration, started on IV fluids.  His HCTZ, lisinopril and Lasix was stopped.  The patient's CT head did not show any acute changes.  The patient had history of agitation, hallucinations and has been at home with bedridden status with multiple medical problems followed by the family at home, brought in here because of the altered mental status.  The patient's urine looks okay.  He was getting Keflex, but during the last discharge for UTI, he was admitted for dehydration, acute kidney injury, started on IV fluids.  Diuretics were stopped.  The patient's kidney function improved nicely today and his mental status also improved.  He had good lunch and patient has been seen by palliative care, Dr. Harvie JuniorPhifer before, recommended hospice evaluation.  The patient's family really interested in hospice at home, so we are trying to arrange the hospice care at home.  Because this is a long weekend we are not able to get somebody from hospice to see him, but we are trying to arrange that at the discharge, but the patient can have the hospice services at home  casemanager is working on that, 2.  Hypertension.  Blood pressure is stable.  The patient's Lasix, HCTZ are stopped.  Blood pressure is 130/68 today.  3.  Diabetes mellitus, type 2.  He is on glipizide and he is eating well.  Sugars are around 122, but if he does not eat he  needs to stop taking the glipizide.  4.  GERD.  Continue PPIs. 5.  RLS.  He is on Requip.   6.  Hypothyroidism.  Continue Synthroid.   The patient's condition at this time is stable.    CODE STATUS:  DNR.   Time spent on discharge preparation, more than 30 minutes.    ____________________________ Katha Hamming, MD sk:ea D: 12/11/2012 14:53:25 ET T: 12/12/2012 02:24:40 ET JOB#: [id number]  cc: Katha Hamming, MD,  <Dictator> Lyndon Code, MD Katha Hamming MD ELECTRONICALLY SIGNED 12/23/2012 22:10

## 2014-12-16 NOTE — H&P (Signed)
PATIENT NAME:  Noah [name], Sopheap A MR#:  [id number] DATE OF BIRTH:  [birth year]  DATE OF ADMISSION:  10/16/2012  PRIMARY CARE PHYSICIAN: Beverely RisenFozia Khan, MD  REFERRING PHYSICIAN: Lucrezia EuropeAllison Webster, MD  CHIEF COMPLAINT: Altered mental status.   HISTORY OF PRESENT ILLNESS: Mr. Noah [name] is a 81 year old Caucasian male with history of bladder cancer and history of recurrent urinary tract infection currently being treated with levofloxacin for bladder infection, started on Monday. His caregiver states that he did fine on Tuesday and Wednesday. However, over the last 24 hours the patient became confused, disoriented, agitated, accusing the caregiver stealing his car, although the car was there at home and he is seeing people at home including his diseased wife. Both the caregiver and also his son reports that every time he has urinary tract infection that does not go well he will get confused and agitated in this shape. They are unable to handle him at home when he is in this situation. The patient admits that he is seeing people, but denies that he accused the caregiver that she sole the care.   REVIEW OF SYSTEMS:   CONSTITUTIONAL: No fever. No chills. No fatigue, although he is a little unsteady was he stands up.   EYES: No blurring of vision. No double vision.  ENT: No hearing impairment. No sore throat. No dysphagia.  CARDIOVASCULAR: No chest pain. No shortness of breath. No syncope.  RESPIRATORY: No cough. No sputum production. No hemoptysis. No chest pain. No shortness of breath.  GASTROINTESTINAL: No abdominal pain. No vomiting. No diarrhea.  GENITOURINARY: He has some dysuria, some discomfort on the suprapubic area. It is unclear if he has frequency of urination or not.  MUSCULOSKELETAL: No joint pain or swelling. No muscular pain or swelling.  INTEGUMENTARY: No skin rash. No ulcers.  NEUROLOGY: No focal weakness, no seizure activity and no headache, but he is confused and disoriented.  PSYCHIATRY: No  anxiety. No depression.  ENDOCRINE: No polyuria or polydipsia. No heat or cold intolerance.  HEMATOLOGY: No lymph node enlargement. No easy bruisability.   PAST MEDICAL HISTORY:  History of bladder cancer, recurrent urinary tract infection, mild dementia, systemic hypertension, diabetes mellitus type 2, gastroesophageal reflux disease, hypothyroidism, paroxysmal atrial fibrillation, chronic obstructive pulmonary disease, restless leg syndrome.   PAST SURGICAL HISTORY:  Removal of melanoma from the upper arm and prostatic cancer status post radiation therapy. He also had prostatectomy. Cataract surgery.   SOCIAL HABITS: Currently nonsmoker. No history of alcohol abuse.   FAMILY HISTORY: His father died at age of 79 with stroke. His mother died at age of 79.   SOCIAL HISTORY: He is widowed, lives at home, and he has a caregiver who stays with him throughout the day. He has a son who visits him.   ADMISSION MEDICATIONS:  1.   Zofran 8 mg 3 times a day p.r.n.  2.   Tramadol 50 mg p.r.n. for pain. 3.   Ropinirole 2 mg 3 times a day. 4.   MiraLAX 17 grams daily as needed for constipation. 5.   Levothyroxine 112 mcg once a day. 6.   Levofloxacin 500 mg daily, started on Monday.  7.   Lasix 20 mg a day. 8.   Hydrochlorothiazide 25 mg a day. 9.   Glimepiride 1 mg once a day.  10. Benazepril 40 mg once a day. 11. Aspirin 81 mg a day. 12. Amlodipine 10 mg a day. 13. Amiodarone 200 mg a day. 14. Alprazolam 0.25 mg once a day as  needed at night.   ALLERGIES:  SULFA CAUSING RASH AND ITCHING.     PHYSICAL EXAMINATION: VITAL SIGNS: Blood pressure 119/64, respiratory rate 18, pulse 66, temperature 98.5, oxygen saturation 95%.  GENERAL APPEARANCE: Elderly male sitting at the side of his stretcher, in no acute distress.  HEAD AND NECK: No pallor. No icterus. No cyanosis.  EARS:  Revealed normal hearing, no discharge, no lesions. Nasal mucosa was normal, no ulcers, no discharge. Oropharyngeal  examination revealed slightly dry mucous membranes, no ulcers, no oral thrush, no exudates.  EYES: Revealed normal eyelids and conjunctiva. Pupils are about 4 mm, round, equal, sluggishly reactive to light.  NECK: Supple. Trachea at midline. No thyromegaly. No cervical lymphadenopathy. No masses.  HEART: Normal S1 and S2. No S3 or S4. No murmur. No gallop. No carotid bruits.  RESPIRATORY: Revealed normal breathing pattern without use of accessory muscles. No rales. No wheezing.  ABDOMEN: Soft without tenderness. No hepatosplenomegaly, no masses, no hernias. SKIN: Revealed no ulcers, no subcutaneous nodules. There are several skin lesions over lower extremities consistent with seborrheic keratosis.  MUSCULOSKELETAL: No joint swelling. No clubbing.  NEUROLOGIC: Cranial nerves II through XII are intact. No focal motor deficit.  PSYCHIATRIC: The patient is confused, disoriented to the place. At one point he said are we in the woods, but then later he indicated that this place is [facility] hospital. He recognized that the President of [place] is [place], but he does not know his name.  He thinks the date is February 16, 20013. Mood and affect are flat along with mild anxiety.   LABORATORY FINDINGS: Serum glucose 133, BUN 33, creatinine 1.5. His baseline BUN and creatinine were 19 and 0.8 last year in June. Sodium 137, potassium 3.9. Calcium 8.4, albumin 3.4. Normal liver transaminases and bilirubin. CBC showed white count of 8000, hemoglobin 13, hematocrit 38 and platelet count 267. Urinalysis showed 28 white blood cells and +1 bacteria.   ASSESSMENT: 1.   Altered mental status probably precipitated by his urinary tract infection on top of a baseline of mild dementia.  2.   Mild acute renal failure secondary to dehydration or prerenal azotemia.   3.   Dehydration.  4.   Urinary tract infection.  5.   Diabetes mellitus, type 2.  6.   Systemic hypertension.  7.   Mild dementia. 8.    Bladder cancer. 9.   His other medical problems include hypothyroidism, gastroesophageal reflux disease and history of prostatic cancer. Also history of paroxysmal atrial fibrillation and chronic obstructive pulmonary disease.   PLAN: We will admit the patient to the medical floor. Neuro checks. Will place sitter as the patient is prone to falling. I will discontinue levofloxacin and send urine for culture and blood cultures. Empiric IV antibiotic using Rocephin pending results of the cultures. Gentle IV hydration to correct the dehydration. I will hold Lasix and hydrochlorothiazide. Continue the rest of home medications as listed. Repeat basic metabolic profile tomorrow to ensure improvement in his BUN and creatinine. The patient's code status is DO NOT RESUSCITATE, as indicated by his son and his caregiver. His son, Peyton Najjar, reported that his father has a LIVING WILL.   TIME SPENT:  In evaluating this patient, reviewing medical records and discussion with the caregiver took more than 55 minutes.  ____________________________ Carney Corners. Rudene Re, MD amd:sb D: 10/16/2012 04:56:00 ET T: 10/16/2012 07:50:50 ET JOB#: [id number]  cc: Carney Corners. Rudene Re, MD, <Dictator> Zollie Scale MD ELECTRONICALLY SIGNED 10/16/2012 22:44

## 2014-12-16 NOTE — Discharge Summary (Signed)
PATIENT NAME:  Noah [name], Noah A MR#:  [id number] DATE OF BIRTH:  03/10/1916  DATE OF ADMISSION:  11/25/2012 DATE OF DISCHARGE:  11/28/2012  ADMISSION DIAGNOSIS: Weakness and lethargy.  DISCHARGE DIAGNOSES:   1.  Weakness and lethargy.   2.  Urinary tract infection.  3.  History of chronic atrial fibrillation.  4.  Hypothyroidism. 5.  Restless leg syndrome.   CONSULTS: None.   PERTINENT LABORATORY AND RADIOLOGICAL DATA: Discharge sodium 141, potassium 3.4, chloride 108, bicarbonate 26, BUN 12, creatinine 0.80, glucose 110. Chest x-ray showed no acute cardiopulmonary disease. CT of the head showed no acute intracranial hemorrhage. Blood cultures are negative. Urine culture showed mixed bacteria.   HOSPITAL COURSE: This is a 81 year old male who presented with lethargy and weakness thought to be secondary to UTI. 1.  Weakness and lethargy with confusion: Likely related to underlying dementia, deconditioning and also underlying UTI. The patient is much better at discharge. He was placed on IV ceftriaxone and the patient will be discharged on p.o. Keflex. Urine culture shows mixed flora, could be a partially treated UTI. The patient was seen by PT. They recommended SNF, but the family would like to take him home with home health care.  2.  UTI: Treated as above.  3.  History of chronic atrial fibrillation: Due to low heart rates, beta blocker was on hold. He will continue on amiodarone. The patient is a high fall risk and not on long-term anticoagulation.  4.  Hypothyroidism: On Synthroid.  5.  Restless leg syndrome: On Requip.  6.  Diabetes: On glimepiride.  7.  Elevated blood pressure: The patient will need close followup at discharge for his blood pressure.   DISCHARGE MEDICATIONS:  1.  Synthroid 112 mcg daily.  2.  Amiodarone 200 mg daily.  3.  Glimepiride 1 mg daily.  4.  HCTZ 25 mg daily.  5.  Lasix 20 mg daily.  6.  Benazepril 40 mg b.i.d.  7.  Haldol 1 mg b.i.d.  8.  Ropinirole 2 mg  t.i.d.  9.  Tramadol 50 mg t.i.d. p.r.n. pain.  10.  Promethazine 25 mg t.i.d. p.r.n. nausea.  11.  Keflex 500 mg t.i.d. for 8 days.   DISCHARGE DIET: Low sodium diet.   DISCHARGE ACTIVITY: As tolerated. No heavy lifting.   DISCHARGE HOME HEALTH SERVICES: Physical therapy and nurse. Please check blood pressure at every visit.   DISCHARGE CONDITION: The patient is medically stable for discharge.   TIME SPENT: Approximately 35 minutes.     ____________________________ Janyth ContesSital P. Juliene PinaMody, MD spm:jm D: 12/01/2012 14:28:53 ET T: 12/01/2012 15:44:30 ET JOB#: [id number]  cc: Marysol Wellnitz P. Juliene PinaMody, MD, <Dictator> Lyndon CodeFozia M. Khan, MD Janyth ContesSITAL P Alanda Colton MD ELECTRONICALLY SIGNED 12/02/2012 16:10

## 2014-12-16 NOTE — H&P (Signed)
PATIENT NAME:  Noah Noah [name], Noah Noah [name] MR#:  [id number] DATE OF BIRTH:  12-19-1915  DATE OF ADMISSION:  01/04/2013  ADMITTING PHYSICIAN:  Enid Baasadhika Tildon Silveria, MD.  PRIMARY CARE PHYSICIAN: Dr. Beverely RisenFozia Khan.  REASON FOR ADMISSION:  Generalized weakness and change in mental status.   HISTORY OF PRESENT ILLNESS:  The patient  is Noah [name] 81 year old elderly Caucasian male with past medical history significant for dementia, hypertension, frequent UTIs, and Noah [name] history of prostate and bladder cancer, status post treatment in the past with recent recurrence, COPD, and was brought to the hospital secondary to altered mental status and was found to have urinary tract infection here in t he ED. The patient is very lethargy during my history taking and most of the history is obtained from his caregiver, who is at bedside. The patient had Noah [name] recent hospitalization in April, about Noah [name] month ago, with similar complaints, followed by another hospitalization 2 weeks ago for UTI. He was, at the time, made DO NOT RESUSCITATE and discharged home with Hospice. He has been on antibiotics at home, but has been showing Noah [name] gradual decline with worsening weakness, decreased speaking and altered mental status. The Hospice nurse started the antibiotic for him at home, but since there was no improvement, he was brought to the hospital and in the ED was sent to have urine with several WBCs. leukocyte esterase positive, nitrite positive and also 1+ bacteria and so he is being admitted for the same.   PAST MEDICAL HISTORY:  1.  Hypertension.  2.  Diabetes mellitus.  3.  Gastroesophageal reflux disease.  4.  Dementia.  5.  Recurrent admissions for UTI.  6.  Noah [name] history of recurrent prostate and bladder cancer, status post radiation and prostatectomy with recurrence of the cancer and the patient refused treatment.  7.  Paroxysmal atrial fibrillation.  8.  Diastolic congestive heart failure.  9.  Hypothyroidism.  10.  COPD.   PAST SURGICAL HISTORY:  1.   Total prostatectomy.  2.  Melanoma removal.  3.  Cataract surgery.   ALLERGIES TO MEDICATIONS:  SULFA DRUGS.   SOCIAL HISTORY:  The patient lives at home and has Noah [name] continuous caregiver, has one son who is health-care power of attorney. The patient has been mostly bedbound lately and no history of any smoking or alcohol use.   FAMILY HISTORY:  Not known, but based on old records, his father had stroke and mom lived up to her 6990s.    CURRENT MEDICATIONS:  1.  Amiodarone 200 mg p.o. daily.  2.  Requip 2 mg p.o. by mouth t.i.d.  3.  Phenergan 25 mg t.i.d. as needed for nausea.  4.  Levothyroxine 112 mcg daily.  5.  Amaryl 1 mg by mouth daily.   REVIEW OF SYSTEMS:  Difficult to be obtained in secondary to patient's mental status.    PHYSICAL EXAMINATION:  On admission: VITAL SIGNS:  Temperature is 99.2 degrees Fahrenheit, pulse 77, respirations 20, blood pressure 137/92, pulse ox 94% on room air.  GENERAL:  An elderly male lying in bed, in obvious distress secondary to increased oral secretions and difficulty breathing.  HEENT:  Normocephalic, atraumatic. Pupils equal, round, reacting to light. Anicteric sclerae. Extraocular movements intact. Oropharynx:  Dry mucous membranes, but no erythema, mass or exudates.  NECK:  Supple. No thyromegaly, JVD or carotid bruits. No lymphadenopathy.  LUNGS:  Moving air bilaterally. Coarse rhonchi at the bases and minimal use of accessory muscles on breathing. No wheeze.  CARDIOVASCULAR:  S1, S2  regular rate and rhythm, 3/6 systolic murmur. No rubs or gallops.  ABDOMEN:  Soft, obese, nontender, nondistended. No hepatosplenomegaly. Normal bowel sounds.  EXTREMITIES:  Does have 1+ pedal edema. No clubbing or cyanosis. Poor dorsalis pedis pulses palpable bilaterally.  SKIN:  No acne, rash or lesions.  LYMPHATICS:  No cervical lymphadenopathy.  NEUROLOGIC:  The patient is able to follow commands. Move all 4 extremities, generalized weakness present with no  focal neurological deficits.  PSYCHOLOGICAL:  The patient is very drowsy, easily arousable and following some simple commands at this time.   LABORATORY DATA:  On admission:  WBC 11.7, hemoglobin 14.8, hematocrit 40.8, platelet count 283.   Magnesium 1.3, troponin is negative. Blood cultures are pending. Urine cultures are pending. Urinalysis with nitrite positive, leukocyte esterase positive, WBCs, RBCs and bacteria.   Sodium 137, potassium 4.2, chloride 106, bicarb 24, BUN 12, creatinine 1.0, glucose 135 and calcium of 8.4 and chest x-ray was clear on admission with possible developing right-sided infiltrate.   ASSESSMENT AND PLAN:  This is Noah [name] 81 year old male with past medical history of dementia, hypertension, recurrent prostate and bladder cancer with 2 admissions last month, brought from home secondary to altered mental status and generalized weakness and found to have significant urinary tract infection here.  1.  Systemic inflammatory response syndrome with low-grade fevers, leukocytosis, likely secondary to urinary tract infection. Blood and urine cultures are drawn here, Noah [name] Foley catheter is placed, started on meropenem secondary to prior sensitivities of Pseudomonas and Proteus in the past and continue to monitor IV fluids.  2.  Acute bronchitis versus aspiration pneumonia with right lower lobe developing infiltrate on chest x-ray. Started on dysphagia diet with nectar-thick liquids and Speech consult. Secretions increased so we will start glycopyrrholate(Rubinol) and scopolamine patch as needed.  3.  Hypertension. Continue to hold oral medications at this time. Blood pressure is low normal anyway.  4.  Diabetes mellitus. Placed on sliding scale insulin.  5.  Altered mental status secondary to metabolic encephalopathy. 6.  Recurrent bladder cancer, is being followed by Hospice at home. The patient had prior radiation treatment and refused further treatment of his bladder cancer when the  recurrence was diagnosed. Appears much sicker at this time, refused to go to [facility]ospice Home in the past. Explained the nature of critical illness and poor prognosis to son and also caregiver at bedside. We will reconsult Palliative Care this admission.   CODE STATUS:  DO NOT RESUSCITATE.   TIME SPENT ON ADMISSION:  50 minutes.  ____________________________ Enid Baas, MD rk:jm D: 01-26-2013 17:20:00 ET T: Jan 26, 2013 18:13:14 ET JOB#: [id number]  cc: Enid Baas, MD, <Dictator> Lyndon Code, MD Enid Baas MD ELECTRONICALLY SIGNED 01/15/2013 12:41

## 2014-12-16 NOTE — H&P (Signed)
PATIENT NAME:  Shyrl NumbersCLARK, Noah A MR#:  [id number] DATE OF BIRTH:  1916-07-16  DATE OF ADMISSION:  11/25/2012  PRIMARY CARE PHYSICIAN: Dr. Beverely RisenFozia Khan   REFERRING ER PHYSICIAN:  Dr. Mindi JunkerGottlieb   CHIEF COMPLAINT: Weakness and lethargy for the last 3 days   HISTORY OF PRESENT ILLNESS: The patient is a 81 year old male with a past medical history of bladder cancer, recurrent urinary tract infection, mild dementia, systemic hypertension, diabetes, gastroesophageal reflux disease, hypothyroidism, paroxysmal atrial fibrillation and chronic obstructive pulmonary disease, restless leg syndrome. Lives active and  healthy life with his son and daughter-in-law at home. As per his son and daughter-in-law in the room, he had 89th birthday on Saturday and he was walking at that time and at his baseline, but then that evening he started worsening, was very weak and was unable to walk or get up from the bed without support and family had to support him to stand up and go to the bathroom. Gradually,  he became more lethargic and he lost control on his urine also, he has to wear Pampers. He was peeing, but without control, so they decided to bring him over here. On further questioning, the family says that he did not have any fever, chest pain, has some cough, but no sputum production. No shortness of breath and no fall.    REVIEW OF SYSTEMS: Unable to get, as the patient is mildly lethargic and history obtained from his son and relative present in the room.   PAST MEDICAL HISTORY: 1.  Bladder cancer.  2.  Recurrent urinary tract infection.  3.  Mild dementia.  4.  Systemic hypertension.  5.  Diabetes mellitus type 2.   6.  Gastroesophageal reflux disease.  7.  Hypothyroidism. 8.  Paroxysmal atrial fibrillation.  9.  Chronic obstructive pulmonary disease.  10.  Restless leg syndrome.   PAST SURGICAL HISTORY:  1.  Removal of melanoma from upper.  2.  Prostate cancer, status post radiation therapy. 3.   Prostatectomy.  4.  Cataract surgery.   SOCIAL HISTORY: Nonsmoker, no alcohol abuse. Lives with his son and able to walk on his own before this sickness started.   FAMILY HISTORY: Father died at age of 79 with a stroke. Mother died at age of 79.   HOME MEDICATIONS: Amiodarone 200 mg once a day, amlodipine 10 mg once a day, Augmentin 1 tablet every 12 hours, benazepril 40 mg 2 times a day, furosemide 20 mg once a day, glipizide 1 mg once a day, Haldol 1 mg 2 times a day, hydrochlorothiazide 25 mg once a day, levothyroxine 112 mcg oral tablet once a day, metoprolol 25 mg 2 times a day,  promethazine 25 mg oral tablet 3 times a day as needed for nausea, ropinirole 2 mg oral tablet 3 times a day, tramadol 50 mg oral tablet 3 times a day as needed for pain.   PHYSICAL EXAMINATION:  VITAL SIGNS:  In ER: Temperature 98.9, pulse rate 56, respirations 20, blood pressure 135/55 and pulse oximetry 95% on room air.  GENERAL: He is alert, but mildly lethargic, but he knows where he is and why he came over here, but appears to be very weak and not able to speak much  HEENT: Head and neck atraumatic. Conjunctivae pink. Oral mucosa dry.  NECK: Supple. No JVD.  RESPIRATORY: Bilateral clear and equal air entry.  CARDIOVASCULAR: S1, S2 present, irregular. No murmur.  ABDOMEN: Soft, nontender. Bowel sounds present. No organomegaly.  SKIN: No rashes.  LEGS: No edema.  JOINTS: No swelling or tenderness.  NEUROLOGICAL: Power 4/5 in both upper limbs and 3/5 in both lower limbs. No tremors.   LABORATORY, DIAGNOSTIC AND RADIOLOGIC DATA:  Glucose 108, BUN 34, creatinine 1.57, sodium 140, potassium 3.0, chloride 104, CO2 26, calcium 8.3, total protein 7.2, bilirubin 0.5, SGOT 29, SGPT 26. Troponin less than 0.02. WBC 13.3, hemoglobin 13 and platelet count 274. Urinalysis is positive with 35 WBCs and 2+ leukocyte esterase.  A CT of the head is negative, no evidence of acute ischemic or hemorrhaging infarct.   Stable  atrophic changes are present. No intracranial mass effect or hydrocephalus. Mild mucoperiosteal thickening involving the ethmoidal sinus.    ASSESSMENT AND PLAN: A 81 year old male with past medical history of bladder cancer, prostatic cancer, recent urinary tract infection, hypertension, diabetes, paroxysmal atrial fibrillation and mild dementia came with progressive lethargy and weakness, found to have a urinary tract infection.  1.  Urinary tract infection. We will give IV Rocephin as started by ER. Urine culture and blood culture to be followed.  2.  Acute renal failure. Baseline creatinine is normal. Currently, creatine is 1.5. We will give IV fluids and follow. 3.  Hypertension. We will hold blood pressure medications for now, as he has urinary tract infection. Blood pressure is running in acceptable range, may restart tomorrow if his renal function comes up and urinary tract infection comes under control. Currently he is dehydrated and we have given IV fluids, so we will hold blood pressure medication.  4.  Weakness and lethargic due to urinary tract infection. We will call physical therapy evaluation.  5.  Diabetes. We will give insulin sliding scale coverage.  6. Paroxysmal atrial fibrillation. No anticoagulation due old age and weakness. Rate controlled with medications currently with on hold due to dehydration and infection. We will restart once his dehydration resolves.   CODE STATUS: DO NOT RESUSCITATE.   Healthcare power of attorney is his son.   TOTAL TIME SPENT: 50 minutes.    ____________________________ Hope Pigeon Elisabeth Pigeon, MD vgv:cc D: 11/25/2012 16:46:37 ET T: 11/25/2012 17:15:54 ET JOB#: [id number]  cc: Hope Pigeon. Elisabeth Pigeon, MD, <Dictator> Altamese Dilling MD ELECTRONICALLY SIGNED 12/06/2012 22:09

## 2014-12-16 NOTE — Discharge Summary (Signed)
PATIENT NAME:  Noah [name], Noah [name] MR#:  [id number] DATE OF BIRTH:  01-15-1916  DATE OF ADMISSION:  01/04/2013 DATE OF DISCHARGE:  01/06/2013  ADMITTING PHYSICIAN: Gladstone Lighter, MD   PRIMARY CARE PHYSICIAN: Clayborn Bigness, MD  [place]: Palliative Care consultation by [facility].   DISCHARGE DIAGNOSES:  1.  Systemic inflammatory response syndrome.  2.  Urinary tract infection.  3.  Acute bronchitis.  4.  Aspiration pneumonia.  5.  Hypertension.  6.  Diabetes mellitus.  7.  Metabolic encephalopathy.  8.  Recurrent bladder cancer.   DISCHARGE MEDICATIONS:  1.  Scopolamine patch every 3 days.  2.  Roxanol 0.25 to 0.5 mL every 1 to 2 hours as needed for pain and dyspnea.  3.  Ativan 0.5 mg 1 to 2 tablets sublingual every 2 to 4 hours as needed for agitation and anxiety.   LABORATORY AND IMAGING STUDIES PRIOR TO DISCHARGE:  Urinalysis is nitrite positive, leukocyte esterase positive with more than 26,000 WBCs, with 1+ bacteria and also several RBCs.  Sodium 137, potassium 4.2, chloride 106, bicarbonate 24, BUN 12, creatinine 1.0, glucose 135 and calcium of 8.4.  ALT 22, AST 34, alk phos 89, total bilirubin is 0.9, albumin 2.8.  Chest x-ray showing no acute cardiopulmonary disease.  WBC 11.7, hemoglobin 14.3, hematocrit 40.8, platelet count is 280.   BRIEF HOSPITAL COURSE:  [name] is a 81 year old elderly gentleman with past medical history significant for hypertension, diabetes, dementia, hypothyroidism, diastolic CHF and history of prostate and bladder cancer, status post prior radiation and surgery with recurrence recently and has opted not to manage it, who has been having decline in clinical condition lately at home, brought to the hospital secondary to worsening change in mental status and progressive weakness. In the ER, he was found to have UTI and was admitted for systemic inflammatory response syndrome secondary to the same and also possible aspiration  pneumonia.   Systemic inflammatory response syndrome:  Likely secondary to UTI, prior history of Pseudomonas and Proteus UTIs and was placed on meropenem IV while in the hospital here with very poor clinical response.  All the WBCs seen in his urine are probably just not related to his infection, but most of it is also related to his recurrent cancer and blood in his urine, which is why his RBCs are up though his leuk esterase and bacteria are only 1+.  He was not responding clinically to the antibiotics and also had increased secretions which he was not able to clear, with extremely poor mental status.  He probably had underlying aspiration pneumonia and also bronchitis. He was placed on glycopyrrolate and scopolamine here for his secretions.  Because of his clinical worsening, and he was already being followed by Hospice at home, Palliative Care was consulted; and after giving 3 days' time to the patient to see if any response to the treatment without much  improvement, the family has decided to transfer the patient to [place]. So, the patient is on [place] and is being transferred to [place] at this time.   DISCHARGE CONDITION: Guarded with extremely poor prognosis.   DISCHARGE DISPOSITION: Hospice Home.   TIME SPENT ON DISCHARGE: 40 minutes.   ____________________________ Gladstone Lighter, MD rk:cb D: 01/15/2013 17:10:36 ET T: 01/17/2013 20:26:15 ET JOB#: [id number]  cc: Gladstone Lighter, MD, <Dictator> Gladstone Lighter MD ELECTRONICALLY SIGNED 01/15/2013 12:40

## 2014-12-16 NOTE — H&P (Signed)
PATIENT NAME:  Noah [name], REIMERS MR#:  [id number] DATE OF BIRTH:  10-Aug-1916  DATE OF ADMISSION:  12/10/2012  PRIMARY CARE PHYSICIAN: Beverely Risen, MD  REFERRING PHYSICIAN: Malachy Moan, MD    CHIEF COMPLAINT:  1. Altered mental status, delirium.  2. Status post fall when being transported here to the ER.   HISTORY OF PRESENT ILLNESS: The patient is a 81 year old gentleman with history of recurrent bladder cancer, recurrent urinary tract infections, multiple admissions, mild dementia, systemic hypertension, diabetes, GERD, hypothyroidism, paroxysmal atrial fibrillation, congestive heart failure, and chronic obstructive pulmonary disease.  The patient was never a smoker. He has second-hand smoking. He comes with a history of 48 hours of dehydration and getting agitated with hallucinations, especially at night. He does have baseline dementia, but, as per the family, he was quite active. He was very oriented in general time and situational. He knows what is going on in the news. He knows who the president is.  He has really good pas memory, but his short-term memory is very bad.   The patient has had a significant decline. He used to walk and get around with a walker  around the whole house, but now he is in bed all the time, and this is for the past 2 weeks since the last discharge. The patient has had a significant decline to the point that he is not eating much or drinking much. He gets rehydrated really easily. Apparently he has been having a chronic cough for the past month. He has been really constipated, and the last bowel movement was 5 days ago after an enema. He is developing a yeast infection on his car scrotum, and overall he denies any fevers or chills. He has been bedbound for the past 2 weeks. He uses a diaper. He is not continent.   The family decided to bring him over here due to worsening of his altered mental status and hallucinations, mostly delirium.  On his way here, the EMS dropped him  on the floor on the stretcher, and he hit his head on the pavement. CT scan of the head was done just to evaluate for bleeding, is negative. CT of the neck was done as well and did not have any acute abnormalities.   REVIEW OF SYSTEMS: Unable to obtain a full review of systems as the patient is having altered mental status and confusion, but from the family I know the patient does not have any fever. He has been coughing. He is constipated. He has a yeast infection, and he is having severe hallucinations.   PAST MEDICAL HISTORY: 1. Recurring urinary tract infection.  2. Multiple hospitalizations.  3. Dementia, which has been described as mild, but I will call it moderate-to-severe now. I will call it also progressive.  4. History of bladder cancer, recurrent/active, status post radiation and seeds.  5. Hypertension.  6. Type 2 diabetes.  7. GERD.  8. Hypothyroidism.  9. Paroxysmal atrial fibrillation.  10. COPD due to secondhand smoking and occupational hazards of [place].  11. Diastolic CHF, compensated.   ALLERGIES: SULFA DRUGS.   PAST SURGICAL HISTORY: 1. Total prostatectomy due to prostate cancer.  2. Melanoma removal.  3. Cataract surgery.   SOCIAL HISTORY: The patient was never a smoker. He did have severe secondhand smoking due to his wife and coworkers at the [employer]. He does not drink. He lives with his son right now. He was able to walk around the house before everything started, but  now he is bedbound, and this is within the past couple of weeks.   FAMILY HISTORY: CVA in his father, who died at the age of 79.  Natural causes in her mother at the age of 79.   CURRENT MEDICATIONS:  1. Ropinirole 2 mg 3 times daily.  2. Promethazine 25 mg 3 times daily.  3. Levothyroxine 112 mcg once a day.  4. Hydrochlorothiazide 25 mg once a day.  5. Haloperidol 1 mg twice daily.  6. Glimepiride 1 mg once a day.  7. Furosemide 20 mg once daily.  8. Cephalexin 500 mg 3 times a  day for the past 8 days.  9. Benazepril 40 mg twice daily. 10. Amiodarone 200 mg once a day.   PHYSICAL EXAMINATION: VITAL SIGNS: Blood pressure 132/56, pulse 78, respirations 20, temperature 98.7, pulse oximetry 93% on room air.  GENERAL: The patient is lethargic, but he awakened to stimulus and answering some questions. He is confused. No acute distress. No respiratory distress. Hemodynamically stable.  HEENT: Pupils are equal and reactive. Extraocular movements are intact. Anicteric sclerae. Pink conjunctivae. Mucosa are dry. No oral lesions. No oropharyngeal exudates.  NECK: Supple. No JVD. No thyromegaly. No adenopathy. No carotid bruits. No rigidity. No masses. Trachea central.  CARDIOVASCULAR: Regular rate and rhythm. No murmurs, rubs, or gallops. The patient has history of paroxysmal atrial fibrillation, right now normal sinus rhythm. No displacement of PMI. No tenderness to palpation of anterior chest wall.  LUNGS: Clear without any wheezing or crepitus. No use of accessory muscles.  ABDOMEN: Soft, nontender, nondistended . No hepatosplenomegaly. No masses. Bowel sounds are positive.  GENITAL: Positive severe intertrigo or yeast rash of testicles and groin area.  EXTREMITIES: No edema, no cyanosis, no clubbing. Pulses +2. Capillary refill less than 3.  SKIN: No rashes or petechiae other than on the genital area.  NEUROLOGIC: Cranial nerves II through XII seem intact. The patient is lethargic, but he awakens after continuous stimulation, and he is able to follow simple commands. He moves 4 extremities, and strength seems to be equal, although the patient is chronically debilitated. Strength is 4 out of 5 in 4 extremities. Deep tendon reflexes +2, pulses +2. Capillary refill less than 3.  MUSCULOSKELETAL: No significant joint abnormalities or edema. No joint effusions.  PSYCHIATRIC: The patient has a flat affect and lethargy.  LYMPHATICS: Negative for lymphadenopathy in the neck or  supraclavicular areas.   LABORATORY AND RADIOLOGICAL DATA:  Labs show a urinalysis with 8 white blood cells. She just recently was treated with Keflex, negative nitrites, positive leukocyte esterase. White blood cells 10, hemoglobin 13, platelets 303, total CK 579. Troponin is 0.02. LFTs have slight elevation of AST at 51. Albumin is 3.3. Creatinine is 1.37.  His baseline is normal 0.8. His BUN is elevated at 22.  Glucose is 92. Other electrolytes are within normal limits.   Chest x-ray: No signs of acute infiltrates. As mentioned above, he had a CT of the head and CT of the neck after a fall, and he does have an ecchymosis of the left side of the face.  CT of the head shows chronic and involutional changes without evidence of acute abnormalities, mostly evidence of chronic ischemia.  Multilevel degenerative changes without evidence of acute osseous abnormalities seen on the cervical spine.   Chest x-ray: Again, no problems.  Ankle on the right side:  No acute bony abnormalities or fractures.   ASSESSMENT AND PLAN: A 81 year old gentleman with history of progressive dementia,  recurrent urinary tract infections, bladder cancer, diabetes, hypertension, severe weakness, comes with altered mental status and worsening of weakness and delirium.   1. Altered mental status/delirium: The patient has been having hallucinations likely due to metabolic encephalopathy. He has dementia baseline, and he has multiple urinary tract infections. At this moment, I am not quite sure if he has an active urinary tract infection. He has been recently treated with antibiotics. He was discharged within a week ago on Keflex. At this moment, he is done with that treatment. I am not going to continue it, and I am going to do a  urine culture as the patient only has 8 white blood cells and positive leukocyte esterase, negative nitrites.  2. The patient does have history of bladder cancer for which he is preconditioned to have  urinary tract infections. As well, he has diabetes. I would recommend that the patient will have chronic suppressive therapy with antibiotics if he is discharged as he continues to have urinary tract infections. Anothercause on his altered mental status are dehydration as the patient seems very dehydrated. We are going to give him IV fluids to see if he perks up.  3. Acute kidney injury: The patient has a creatinine that is slightly elevated but more than 3 points from his baseline.  His BUN is 32, for which I am going to hold on nephrotoxins, especially hold on his diuretic drugs. He is on a 2 diuretics.  He is on a thiazide  diuretic and Lasix. I would recommend that the patient is taken off completely hydrochlorothiazide. IV fluids gently to rehydrate as the patient has history of CHF, diastolic.  4. Dementia, is progressive:  He has significant decline within the past couple of months but most visible within the last couple of weeks. He is having significant hallucinations, changes in his mental status. This could be just part of his dementia. He does have vascular dementia, but consider in the differential Lewy body dementia and dementia based or caused by Priones.  5. I would recommend the patient to have a palliative consult evaluation.  I am not quite sure if he meets criteria for hospice care, but he is definitely about to get there or getting there.  6. Hypertension: Continue treatment with current medications except for thiazides and Lasix. Diabetes: Continue treatment with glipizide.  7. History of atrial fibrillation, paroxysmal:  At this moment, he is normal sinus rhythm. Continue treatment with amiodarone.  8. Please note also that the patient is taking benazepril.  We are going to stop it due to his acute kidney injury.  9. Other medical problems seem to be stable.   CODE STATUS:  The patient is a DNR.   TIME SPENT: I spent about 50 minutes with this patient and his family.    ____________________________ Felipa Furnace, MD rsg:cb D: 12/10/2012 19:47:28 ET T: 12/10/2012 20:11:02 ET JOB#: [id number]  cc: Felipa Furnace, MD, <Dictator> Broxton Broady Juanda Chance MD ELECTRONICALLY SIGNED 12/11/2012 13:40

## 2014-12-16 NOTE — Discharge Summary (Signed)
PATIENT NAME:  Noah [name], Noah [name] MR#:  [id number] DATE OF BIRTH:  25-Mar-1916  DATE OF ADMISSION:  10/16/2012 DATE OF DISCHARGE:  10/20/2012  ADMITTING DIAGNOSES: 1.  Altered mental status.  2.  Moderate renal failure,. 3.  Dehydration.  4.  Urinary tract infection.   DISCHARGE DIAGNOSES: 1.  Although that the status is unclear, the etiology at this time is suspected dehydration, polypharmacy related, resolved.  2.  Acute renal failure, resolved with intravenous fluids.  3.  Dehydration.  4.  Diabetes mellitus.  5.  Hypertension dementia.  6.  Hypothyroidism.  7.  Gastroesophageal reflux disease. 8.  History of prostate as well as bladder cancer. 9.  Paroxysmal atrial fibrillation in sinus rhythm now.  10.  Chronic obstructive pulmonary disease.   DISCHARGE CONDITION: Stable.   DISCHARGE MEDICATIONS:  1.  Amiodarone 200 mg p.o. daily. 2.  Bumetanide 1 mg p.o. in the morning. 3.  Alprazolam 0.25 mg p.o. at bedtime as needed.  4.  Thyroxine 112 mcg p.o. daily.  5.  Ropinirole 2 mg 3 times daily.  6.  Promethazine 25 mg p.o. 3 times daily as needed.  7.  Aspirin 81 mg p.o. daily.  8.  Amlodipine 10 mg p.o. daily.  9.  Polyethylene glycol 17 grams p.o. daily as needed.  10.  Benazepril 40 mg p.o. twice daily. This is a new dose.  11.  Zofran 8 mg p.o. 3 times daily as needed.  12.  Haloperidol 1 mg p.o. twice daily.  13.  Metoprolol 25 mg p.o. twice daily.  14.  The patient is not to take Lasix, hydrochlorothiazide, tramadol or levofloxacin as they may affect his sensorium.   HOME OXYGEN:  None.   DIET: 2 grams salt, low fat, low cholesterol, carbohydrate-controlled diet, mechanical soft.   ACTIVITY LIMITED:  As tolerated.   REFERRALS: Physical therapy 2 to 7 times a week.   FOLLOW UP:  Follow-up appointment with Dr. Beverely Risen in 2 days after discharge.   CONSULTANTS:  Care management.   RADIOLOGIC STUDIES: Chest, portable single view, 10/17/2012 revealed no focal  pneumonia. Cannot exclude low-grade compensated CHF according to radiologist. CT scan of head without contrast 10/17/2012 revealed no evidence of acute ischemic or hemorrhagic infarction, no intracranial mass effect or hydrocephalus.  There are chronic stable atrophic changes present. Ultrasound of kidneys, bilateral on 10/18/2012 revealed no evidence of obstruction in either kidney, no suspicious appearing renal masses were demonstrated, no abnormal perinephric fluid collections.  The echo textures of  renal parenchyma remained lower than that of adjacent liver lesion.   HISTORY OF PRESENT ILLNESS:   The patient is a 81 year old Caucasian male with past medical history significant for history of multiple medical problems including history of bladder cancer, recurrent urinary tract infections, dementia, hypertension, diabetes mellitus, gastroesophageal reflux disease, who presented to the hospital on the 10/16/2012 with complaints of altered mental status. Please refer to Dr. Riley Nearing admission note on the 10/16/2012.  On arrival to the hospital, the patient's vitals showed the temperature was 98.5, pulse was 66, respiration rate 18, blood pressure 119/64, saturation was 95% on oxygen therapy.  Physical exam was unremarkable.    LABORATORY AND DIAGNOSTIC DATA:   Initially on arrival to the Emergency Room on 10/16/2012, revealed mild elevation of BUN and creatinine to 33 and 1.55. Glucose was 133, bicarbonate level was normal at 26.  Estimated GFR for non-African American would be 38. The patient's liver enzymes were normal. The patient TSH was elevated at  6.74, however, the patient's free thyroxine was 1.31, which was within normal limits. White blood cell count was normal at 8.5, hemoglobin was 13.0, platelet count 267.  Blood cultures taken on the 10/16/2012 showed no growth. Urinalysis was remarkable for yellow hazy urine, negative for glucose, bilirubin or ketones, specific gravity 1.012, pH was 5.0, 1+  blood, negative for protein and nitrites, 1+  leukocyte esterase was noted, 4 red blood cells and 28 white blood cells were noted, although the patient's urine cultures were no growth. The patient's EKG showed normal sinus rhythm at 65 beats per minute, nonspecific ST and T changes.  The patient's chest x-ray was unremarkable.   HOSPITAL COURSE:  The patient was admitted to the hospital for a diagnosis of altered mental status which was felt to be due to delirium, related to urinary tract infection; however, he was initiated on antibiotic therapy initially; however, when patient's urine cultures came back negative, the patient's antibiotic therapy was stopped. He was rehydrated for his acute renal failure and over a period of time and he felt more comfortable and his altered mental status including hallucinations, resolved. It was felt that patient's altered mental status was very likely dehydration as well as possibly polypharmacy related. The patient's diuretics were stopped including Lasix as well as hydrochlorothiazide, medications which can alter his sensorium and a combination of Levaquin as well as tramadol were also stopped. The patient was rehydrated and his acute renal failure resolved.   By the day of discharge, the patient's creatinine is normal. On 10/20/2012, the patient's BUN and creatinine were normal at 16 and was 0.97. It is recommended to follow the patient's oral intake and make decisions about rehydrating him if needed. It is recommended also to encourage his oral intake, especially his oral fluid intake. It is unclear why the patient had acute renal failure as well as dehydration but it was felt that it could have been related to diuretics he was using as well as possibly diabetes mellitus itself.  The patient's hemoglobin A1c was checked; however, results are still pending at the time of dictation.   In regards to chronic medical problems such as such as history of diabetes,  hypertension, gastroesophageal reflux disease, paroxysmal atrial fibrillation with COPD, the patient is to continue his outpatient management. The patient's blood pressure was noted to be severely elevated.  The patient's blood pressure medications were advanced to current levels. On the day of discharge, the patient's vital signs are stable with temperature 97.8, pulse 57, respiration rate 18, blood pressure 134/68, saturation was 94% on room air.  it was felt that the patient is stable to be discharged to skilled nursing facility for rehabilitation after physical therapist recommended short-term rehabilitation placement.   Time spent:  40 minutes.  ___________________________ Katharina Caperima Zurisadai Helminiak, MD rv:ct D: 10/20/2012 11:12:26 ET T: 10/20/2012 11:53:35 ET JOB#: [id number]  cc: Katharina Caperima Christina Waldrop, MD, <Dictator> Lyndon CodeFozia M. Khan, MD Katharina CaperIMA Nijah Tejera MD ELECTRONICALLY SIGNED 11/16/2012 12:49

## 2014-12-18 NOTE — Op Note (Signed)
PATIENT NAME:  Shyrl NumbersCLARK, Noah A MR#:  [id number] DATE OF BIRTH:  12-21-1915  DATE OF PROCEDURE:  01/29/2012  PREOPERATIVE DIAGNOSES:  1. Abnormal urine cytology with history of carcinoma in situ.  2. Bladder erythema.   POSTOPERATIVE DIAGNOSES:  1. Abnormal urine cytology with history of carcinoma in situ.  2. Bladder erythema.   PROCEDURES: Cystoscopy with bladder biopsies.   SURGEON: Irineo AxonScott Lolitha Tortora, M.D.   ASSISTANT: None.   ANESTHESIA: General.   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 He completed a second reinduction course of BCG in December 2012. Recent surveillance cystoscopy was remarkable for erythema of the posterior wall, a urine cytology was felt positive for malignant cells, and a FISH was positive. He presents for cystoscopy under anesthesia with bladder biopsies.   DESCRIPTION OF PROCEDURE: The patient was taken to the Operating Room where a general anesthetic was administered. He was placed in the low lithotomy position and his external genitalia were prepped and draped in the usual fashion. Time out was performed per protocol. A 21 French cystoscope with 30 degree lens was lubricated and passed under direct vision. There was a wide caliber bulb stricture present which the scope easily negotiated. Prostate shows mild to moderate enlargement of the lateral lobes. The bladder mucosa was closely inspected with 30 and 70 degree lenses. On the right posterior wall was an area of erythema and friability. There was a second area in the upper posterior wall/dome region. No papillary tumors were seen. The ureteral orifices were normal appearing with clear efflux bilaterally. Cold biopsies were taken of these areas and sent separately. Biopsy sites and erythema were then fulgurated with a Bugbee electrode. At the completion of the procedure, hemostasis was adequate. The bladder was emptied and the  cystoscope was removed. An 5018 French catheter was placed without difficulty. The bladder was irrigated with return of minimally blood tinged effluent. A B and O suppository was placed per rectum. The prostate was flat and smooth without nodularity. The patient was taken to PAC-U in stable condition. There were no complications. EBL was minimal.  ____________________________ Verna CzechScott C. Lonna CobbStoioff, MD scs:slb D: 01/29/2012 09:43:22 ET T: 01/29/2012 09:52:19 ET JOB#: [id number]  cc: Lorin PicketScott C. Lonna CobbStoioff, MD, <Dictator> Riki AltesSCOTT C Alee Gressman MD ELECTRONICALLY SIGNED 01/31/2012 12:25
